# Patient Record
Sex: Male | Born: 1970 | Race: White | Hispanic: No | Smoking: Current every day smoker
Health system: Southern US, Community
[De-identification: ages and names within clinical notes are randomized; demographics above are authoritative.]

## PROBLEM LIST (undated history)

## (undated) DIAGNOSIS — I509 Heart failure, unspecified: Secondary | ICD-10-CM

## (undated) DIAGNOSIS — F319 Bipolar disorder, unspecified: Secondary | ICD-10-CM

## (undated) DIAGNOSIS — I2699 Other pulmonary embolism without acute cor pulmonale: Secondary | ICD-10-CM

## (undated) DIAGNOSIS — I429 Cardiomyopathy, unspecified: Secondary | ICD-10-CM

## (undated) DIAGNOSIS — F32A Depression, unspecified: Secondary | ICD-10-CM

## (undated) DIAGNOSIS — I214 Non-ST elevation (NSTEMI) myocardial infarction: Secondary | ICD-10-CM

## (undated) DIAGNOSIS — J439 Emphysema, unspecified: Secondary | ICD-10-CM

## (undated) DIAGNOSIS — F1990 Other psychoactive substance use, unspecified, uncomplicated: Secondary | ICD-10-CM

## (undated) DIAGNOSIS — M6282 Rhabdomyolysis: Secondary | ICD-10-CM

## (undated) DIAGNOSIS — F329 Major depressive disorder, single episode, unspecified: Secondary | ICD-10-CM

## (undated) HISTORY — DX: Rhabdomyolysis: M62.82

## (undated) HISTORY — DX: Cardiomyopathy, unspecified: I42.9

## (undated) HISTORY — PX: HAND SURGERY: SHX662

## (undated) HISTORY — DX: Non-ST elevation (NSTEMI) myocardial infarction: I21.4

---

## 1998-09-12 ENCOUNTER — Emergency Department (HOSPITAL_COMMUNITY): Admission: EM | Admit: 1998-09-12 | Discharge: 1998-09-12 | Payer: Self-pay | Admitting: Emergency Medicine

## 2005-09-27 ENCOUNTER — Emergency Department (HOSPITAL_COMMUNITY): Admission: EM | Admit: 2005-09-27 | Discharge: 2005-09-27 | Payer: Self-pay | Admitting: Emergency Medicine

## 2006-05-14 ENCOUNTER — Emergency Department (HOSPITAL_COMMUNITY): Admission: EM | Admit: 2006-05-14 | Discharge: 2006-05-14 | Payer: Self-pay | Admitting: Emergency Medicine

## 2011-09-20 DIAGNOSIS — I2699 Other pulmonary embolism without acute cor pulmonale: Secondary | ICD-10-CM

## 2011-09-20 HISTORY — DX: Other pulmonary embolism without acute cor pulmonale: I26.99

## 2012-02-12 ENCOUNTER — Emergency Department (HOSPITAL_COMMUNITY): Payer: Self-pay

## 2012-02-12 ENCOUNTER — Emergency Department (HOSPITAL_COMMUNITY)
Admission: EM | Admit: 2012-02-12 | Discharge: 2012-02-12 | Disposition: A | Discharge status: Home / Self Care | Payer: Self-pay | Attending: Emergency Medicine | Admitting: Emergency Medicine

## 2012-02-12 ENCOUNTER — Encounter (HOSPITAL_COMMUNITY): Payer: Self-pay | Admitting: Emergency Medicine

## 2012-02-12 DIAGNOSIS — Z86711 Personal history of pulmonary embolism: Secondary | ICD-10-CM | POA: Insufficient documentation

## 2012-02-12 DIAGNOSIS — F319 Bipolar disorder, unspecified: Secondary | ICD-10-CM | POA: Insufficient documentation

## 2012-02-12 DIAGNOSIS — F3289 Other specified depressive episodes: Secondary | ICD-10-CM | POA: Insufficient documentation

## 2012-02-12 DIAGNOSIS — F17200 Nicotine dependence, unspecified, uncomplicated: Secondary | ICD-10-CM | POA: Insufficient documentation

## 2012-02-12 DIAGNOSIS — R197 Diarrhea, unspecified: Secondary | ICD-10-CM | POA: Insufficient documentation

## 2012-02-12 DIAGNOSIS — Z79899 Other long term (current) drug therapy: Secondary | ICD-10-CM | POA: Insufficient documentation

## 2012-02-12 DIAGNOSIS — F1210 Cannabis abuse, uncomplicated: Secondary | ICD-10-CM | POA: Insufficient documentation

## 2012-02-12 DIAGNOSIS — F329 Major depressive disorder, single episode, unspecified: Secondary | ICD-10-CM | POA: Insufficient documentation

## 2012-02-12 DIAGNOSIS — R112 Nausea with vomiting, unspecified: Secondary | ICD-10-CM | POA: Insufficient documentation

## 2012-02-12 HISTORY — DX: Other psychoactive substance use, unspecified, uncomplicated: F19.90

## 2012-02-12 HISTORY — DX: Emphysema, unspecified: J43.9

## 2012-02-12 HISTORY — DX: Major depressive disorder, single episode, unspecified: F32.9

## 2012-02-12 HISTORY — DX: Other pulmonary embolism without acute cor pulmonale: I26.99

## 2012-02-12 HISTORY — DX: Bipolar disorder, unspecified: F31.9

## 2012-02-12 HISTORY — DX: Depression, unspecified: F32.A

## 2012-02-12 LAB — CBC WITH DIFFERENTIAL/PLATELET
Eosinophils Absolute: 0.1 10*3/uL (ref 0.0–0.7)
Eosinophils Relative: 1 % (ref 0–5)
HCT: 42.4 % (ref 39.0–52.0)
Hemoglobin: 14.7 g/dL (ref 13.0–17.0)
Lymphs Abs: 1 10*3/uL (ref 0.7–4.0)
MCH: 30.5 pg (ref 26.0–34.0)
MCV: 88 fL (ref 78.0–100.0)
Monocytes Relative: 10 % (ref 3–12)
RBC: 4.82 MIL/uL (ref 4.22–5.81)

## 2012-02-12 LAB — COMPREHENSIVE METABOLIC PANEL
ALT: 11 U/L (ref 0–53)
AST: 11 U/L (ref 0–37)
Calcium: 9.2 mg/dL (ref 8.4–10.5)
Sodium: 135 mEq/L (ref 135–145)
Total Protein: 8.1 g/dL (ref 6.0–8.3)

## 2012-02-12 MED ORDER — ONDANSETRON HCL 4 MG PO TABS: 4.0000 mg | ORAL_TABLET | Freq: Three times a day (TID) | ORAL | Status: DC | PRN

## 2012-02-12 MED ORDER — SODIUM CHLORIDE 0.9 % IV SOLN
Freq: Once | INTRAVENOUS | Status: AC
  Administered 2012-02-12: 1000 mL via INTRAVENOUS

## 2012-02-12 MED ORDER — SODIUM CHLORIDE 0.9 % IV BOLUS (SEPSIS): 1000.0000 mL | Freq: Once | INTRAVENOUS | Status: DC

## 2012-02-12 MED ORDER — ONDANSETRON HCL 4 MG/2ML IJ SOLN
4.0000 mg | INTRAMUSCULAR | Status: DC | PRN
  Administered 2012-02-12: 4 mg via INTRAVENOUS
  Filled 2012-02-12: qty 2

## 2012-02-12 MED ORDER — SODIUM CHLORIDE 0.9 % IV SOLN
INTRAVENOUS | Status: DC
  Administered 2012-02-12: 14:00:00 via INTRAVENOUS

## 2012-02-12 NOTE — ED Notes (Signed)
Fluid challenge of ginger ale given and tolerated so far

## 2012-02-12 NOTE — ED Provider Notes (Signed)
History     CSN: [id number]  Arrival date & time 02/12/12  1230   First MD Initiated Contact with Patient 02/12/12 1306      Chief Complaint  Patient presents with  . Emesis  . Diarrhea     HPI Pt was seen at 1335.  Per pt, c/o gradual onset and persistence of multiple intermittent episodes of N/V/D that began last night.   Describes the stools as "watery."  States he had another family member with similar illness several days ago. Denies abd pain, no CP/SOB, no back pain, no fevers, no black or blood in stools or emesis.     Past Medical History  Diagnosis Date  . Emphysema   . Depression   . Bipolar 1 disorder   . Pulmonary embolism october 2013  . IV drug user     last IV drug use in 2011    Past Surgical History  Procedure Date  . Hand surgery     History  Substance Use Topics  . Smoking status: Current Every Day Smoker -- 1.0 packs/day    Types: Cigarettes  . Smokeless tobacco: Not on file  . Alcohol Use:      Comment: occasional      Review of Systems ROS: Statement: All systems negative except as marked or noted in the HPI; Constitutional: Negative for fever and chills. ; ; Eyes: Negative for eye pain, redness and discharge. ; ; ENMT: Negative for ear pain, hoarseness, nasal congestion, sinus pressure and sore throat. ; ; Cardiovascular: Negative for chest pain, palpitations, diaphoresis, dyspnea and peripheral edema. ; ; Respiratory: Negative for cough, wheezing and stridor. ; ; Gastrointestinal: +N/V/D. Negative for abdominal pain, blood in stool, hematemesis, jaundice and rectal bleeding. . ; ; Genitourinary: Negative for dysuria, flank pain and hematuria. ; ; Musculoskeletal: Negative for back pain and neck pain. Negative for swelling and trauma.; ; Skin: Negative for pruritus, rash, abrasions, blisters, bruising and skin lesion.; ; Neuro: Negative for headache, lightheadedness and neck stiffness. Negative for weakness, altered level of consciousness ,  altered mental status, extremity weakness, paresthesias, involuntary movement, seizure and syncope.       Allergies  Augmentin and Toradol  Home Medications   Current Outpatient Rx  Name  Route  Sig  Dispense  Refill  . BUDESONIDE-FORMOTEROL FUMARATE 160-4.5 MCG/ACT IN AERO   Inhalation   Inhale 2 puffs into the lungs 2 (two) times daily.         Marland Kitchen DIVALPROEX SODIUM 500 MG PO TBEC   Oral   Take 500-1,000 mg by mouth 2 (two) times daily. 500 mg in the morning and 1000 mg at bedtime         . GABAPENTIN 400 MG PO CAPS   Oral   Take 400 mg by mouth 4 (four) times daily.         Marland Kitchen MIRTAZAPINE 15 MG PO TABS   Oral   Take 30 mg by mouth at bedtime.         Marland Kitchen OLANZAPINE 10 MG PO TABS   Oral   Take 10 mg by mouth at bedtime.         . OMEPRAZOLE 20 MG PO CPDR   Oral   Take 20 mg by mouth daily.         . OXYCODONE HCL ER 20 MG PO T12A   Oral   Take 20 mg by mouth 4 (four) times daily.         Marland Kitchen  PROPRANOLOL HCL 20 MG PO TABS   Oral   Take 20 mg by mouth 2 (two) times daily.            BP 96/51  Pulse 134  Temp 97.6 F (36.4 C) (Oral)  Resp 18  Ht 6' (1.829 m)  Wt 170 lb (77.111 kg)  BMI 23.06 kg/m2  SpO2 98%  Physical Exam 1340: Physical examination:  Nursing notes reviewed; Vital signs and O2 SAT reviewed;  Constitutional: Thin, Well hydrated, In no acute distress; Head:  Normocephalic, atraumatic; Eyes: EOMI, PERRL, No scleral icterus; ENMT: Mouth and pharynx normal, Mucous membranes moist; Neck: Supple, Full range of motion, No lymphadenopathy; Cardiovascular: Regular rate and rhythm, No murmur, rub, or gallop; Respiratory: Breath sounds clear & equal bilaterally, No rales, rhonchi, wheezes.  Speaking full sentences with ease, Normal respiratory effort/excursion; Chest: Nontender, Movement normal; Abdomen: Soft, Nontender, Nondistended, Normal bowel sounds; Genitourinary: No CVA tenderness; Extremities: Pulses normal, No tenderness, No edema, No  calf edema or asymmetry.; Neuro: AA&Ox3, Major CN grossly intact.  Speech clear. No gross focal motor or sensory deficits in extremities.; Skin: Color normal, Warm, Dry.   ED Course  Procedures    MDM  MDM Reviewed: nursing note, vitals and previous chart Interpretation: labs and x-ray   Results for orders placed during the hospital encounter of 02/12/12  COMPREHENSIVE METABOLIC PANEL      Component Value Range   Sodium 135  135 - 145 mEq/L   Potassium 3.6  3.5 - 5.1 mEq/L   Chloride 100  96 - 112 mEq/L   CO2 21  19 - 32 mEq/L   Glucose, Bld 124 (*) 70 - 99 mg/dL   BUN 17  6 - 23 mg/dL   Creatinine, Ser 9.62  0.50 - 1.35 mg/dL   Calcium 9.2  8.4 - 95.2 mg/dL   Total Protein 8.1  6.0 - 8.3 g/dL   Albumin 3.7  3.5 - 5.2 g/dL   AST 11  0 - 37 U/L   ALT 11  0 - 53 U/L   Alkaline Phosphatase 66  39 - 117 U/L   Total Bilirubin 0.3  0.3 - 1.2 mg/dL   GFR calc non Af Amer 88 (*) >90 mL/min   GFR calc Af Amer >90  >90 mL/min  LIPASE, BLOOD      Component Value Range   Lipase 12  11 - 59 U/L  CBC WITH DIFFERENTIAL      Component Value Range   WBC 6.2  4.0 - 10.5 K/uL   RBC 4.82  4.22 - 5.81 MIL/uL   Hemoglobin 14.7  13.0 - 17.0 g/dL   HCT 84.1  32.4 - 40.1 %   MCV 88.0  78.0 - 100.0 fL   MCH 30.5  26.0 - 34.0 pg   MCHC 34.7  30.0 - 36.0 g/dL   RDW 02.7  25.3 - 66.4 %   Platelets 341  150 - 400 K/uL   Neutrophils Relative 72  43 - 77 %   Neutro Abs 4.5  1.7 - 7.7 K/uL   Lymphocytes Relative 17  12 - 46 %   Lymphs Abs 1.0  0.7 - 4.0 K/uL   Monocytes Relative 10  3 - 12 %   Monocytes Absolute 0.6  0.1 - 1.0 K/uL   Eosinophils Relative 1  0 - 5 %   Eosinophils Absolute 0.1  0.0 - 0.7 K/uL   Basophils Relative 0  0 - 1 %  Basophils Absolute 0.0  0.0 - 0.1 K/uL   Dg Abd Acute W/chest 02/12/2012  *RADIOLOGY REPORT*  Clinical Data: 41 year old male with nausea vomiting diarrhea.  ACUTE ABDOMEN SERIES (ABDOMEN 2 VIEW & CHEST 1 VIEW)  Comparison: None.  Findings: Lung volumes  appear large and may be related to body habitus.  Cardiac size and mediastinal contours are within normal limits.  Visualized tracheal air column is within normal limits. The lungs are clear.  Mild right apical lung scarring.  Otherwise the lungs are clear.  No pneumothorax or pneumoperitoneum.  Nonobstructed bowel gas pattern.  Abdominal and pelvic visceral contours are within normal limits. No acute osseous abnormality identified.  Incidental S1 spina bifida occulta.  IMPRESSION:  1. Nonobstructed bowel gas pattern, no free air. 2. No acute cardiopulmonary abnormality.   Original Report Authenticated By: Erskine Speed, M.D.       1605:  Pt did not c/o lightheadedness during orthostatic VS, but HR did increase/BP decrease.  IVF x2L NS given.  Pt states he feels "much better now" and "is hungry" and wants to go home. Pt has tol PO well while in the ED without N/V.  No stooling while in the ED.  Abd benign, VSS.  Dx and testing d/w pt and family.  Questions answered.  Verb understanding, agreeable to d/c home with outpt f/u.         Laray Anger, DO 02/15/12 [phone]

## 2012-02-12 NOTE — ED Notes (Signed)
Pt c/o n/v/d since last night

## 2012-09-21 ENCOUNTER — Emergency Department (HOSPITAL_COMMUNITY): Payer: Self-pay

## 2012-09-21 ENCOUNTER — Inpatient Hospital Stay (HOSPITAL_COMMUNITY)
Admission: EM | Admit: 2012-09-21 | Discharge: 2012-09-26 | DRG: 557 | Disposition: A | Discharge status: Home / Self Care | Payer: Self-pay | Attending: Internal Medicine | Admitting: Internal Medicine

## 2012-09-21 ENCOUNTER — Encounter (HOSPITAL_COMMUNITY): Payer: Self-pay | Admitting: *Deleted

## 2012-09-21 DIAGNOSIS — R7402 Elevation of levels of lactic acid dehydrogenase (LDH): Secondary | ICD-10-CM | POA: Diagnosis present

## 2012-09-21 DIAGNOSIS — R339 Retention of urine, unspecified: Secondary | ICD-10-CM | POA: Diagnosis present

## 2012-09-21 DIAGNOSIS — G8929 Other chronic pain: Secondary | ICD-10-CM | POA: Diagnosis present

## 2012-09-21 DIAGNOSIS — J9600 Acute respiratory failure, unspecified whether with hypoxia or hypercapnia: Secondary | ICD-10-CM | POA: Diagnosis present

## 2012-09-21 DIAGNOSIS — T40605A Adverse effect of unspecified narcotics, initial encounter: Secondary | ICD-10-CM | POA: Diagnosis present

## 2012-09-21 DIAGNOSIS — F17200 Nicotine dependence, unspecified, uncomplicated: Secondary | ICD-10-CM | POA: Diagnosis present

## 2012-09-21 DIAGNOSIS — N179 Acute kidney failure, unspecified: Secondary | ICD-10-CM | POA: Diagnosis present

## 2012-09-21 DIAGNOSIS — G4700 Insomnia, unspecified: Secondary | ICD-10-CM | POA: Diagnosis present

## 2012-09-21 DIAGNOSIS — F319 Bipolar disorder, unspecified: Secondary | ICD-10-CM | POA: Diagnosis present

## 2012-09-21 DIAGNOSIS — I428 Other cardiomyopathies: Secondary | ICD-10-CM | POA: Diagnosis present

## 2012-09-21 DIAGNOSIS — Z86711 Personal history of pulmonary embolism: Secondary | ICD-10-CM

## 2012-09-21 DIAGNOSIS — E119 Type 2 diabetes mellitus without complications: Secondary | ICD-10-CM | POA: Diagnosis present

## 2012-09-21 DIAGNOSIS — R7989 Other specified abnormal findings of blood chemistry: Secondary | ICD-10-CM

## 2012-09-21 DIAGNOSIS — N19 Unspecified kidney failure: Secondary | ICD-10-CM

## 2012-09-21 DIAGNOSIS — M549 Dorsalgia, unspecified: Secondary | ICD-10-CM | POA: Diagnosis present

## 2012-09-21 DIAGNOSIS — I214 Non-ST elevation (NSTEMI) myocardial infarction: Secondary | ICD-10-CM | POA: Diagnosis present

## 2012-09-21 DIAGNOSIS — J040 Acute laryngitis: Secondary | ICD-10-CM | POA: Diagnosis present

## 2012-09-21 DIAGNOSIS — R7401 Elevation of levels of liver transaminase levels: Secondary | ICD-10-CM | POA: Diagnosis present

## 2012-09-21 DIAGNOSIS — N139 Obstructive and reflux uropathy, unspecified: Secondary | ICD-10-CM | POA: Diagnosis present

## 2012-09-21 DIAGNOSIS — T50911A Poisoning by multiple unspecified drugs, medicaments and biological substances, accidental (unintentional), initial encounter: Secondary | ICD-10-CM | POA: Insufficient documentation

## 2012-09-21 DIAGNOSIS — F411 Generalized anxiety disorder: Secondary | ICD-10-CM | POA: Diagnosis present

## 2012-09-21 DIAGNOSIS — J811 Chronic pulmonary edema: Secondary | ICD-10-CM | POA: Diagnosis present

## 2012-09-21 DIAGNOSIS — R748 Abnormal levels of other serum enzymes: Secondary | ICD-10-CM | POA: Diagnosis present

## 2012-09-21 DIAGNOSIS — M6282 Rhabdomyolysis: Principal | ICD-10-CM | POA: Diagnosis present

## 2012-09-21 DIAGNOSIS — Z8249 Family history of ischemic heart disease and other diseases of the circulatory system: Secondary | ICD-10-CM

## 2012-09-21 DIAGNOSIS — E876 Hypokalemia: Secondary | ICD-10-CM | POA: Diagnosis present

## 2012-09-21 DIAGNOSIS — J438 Other emphysema: Secondary | ICD-10-CM | POA: Diagnosis present

## 2012-09-21 DIAGNOSIS — R1310 Dysphagia, unspecified: Secondary | ICD-10-CM | POA: Diagnosis present

## 2012-09-21 LAB — URINALYSIS, ROUTINE W REFLEX MICROSCOPIC
Leukocytes, UA: NEGATIVE
Nitrite: NEGATIVE
Specific Gravity, Urine: 1.015 (ref 1.005–1.030)
Urobilinogen, UA: 0.2 mg/dL (ref 0.0–1.0)

## 2012-09-21 LAB — COMPREHENSIVE METABOLIC PANEL
ALT: 213 U/L — ABNORMAL HIGH (ref 0–53)
Calcium: 8.1 mg/dL — ABNORMAL LOW (ref 8.4–10.5)
GFR calc Af Amer: 52 mL/min — ABNORMAL LOW (ref >90)
Glucose, Bld: 117 mg/dL — ABNORMAL HIGH (ref 70–99)
Sodium: 134 mEq/L — ABNORMAL LOW (ref 135–145)
Total Protein: 6.3 g/dL (ref 6.0–8.3)

## 2012-09-21 LAB — CBC WITH DIFFERENTIAL/PLATELET
Eosinophils Absolute: 0.1 10*3/uL (ref 0.0–0.7)
Eosinophils Relative: 1 % (ref 0–5)
Lymphs Abs: 1.5 10*3/uL (ref 0.7–4.0)
MCH: 30.4 pg (ref 26.0–34.0)
MCV: 89.2 fL (ref 78.0–100.0)
Platelets: 235 10*3/uL (ref 150–400)
RBC: 3.52 MIL/uL — ABNORMAL LOW (ref 4.22–5.81)
RDW: 13 % (ref 11.5–15.5)

## 2012-09-21 LAB — BLOOD GAS, ARTERIAL
Acid-Base Excess: 5.7 mmol/L — ABNORMAL HIGH (ref 0.0–2.0)
Drawn by: 317771
O2 Saturation: 89 %
pH, Arterial: 7.428 (ref 7.350–7.450)

## 2012-09-21 LAB — CK: Total CK: >20000 U/L — ABNORMAL HIGH (ref 7–232)

## 2012-09-21 LAB — RAPID URINE DRUG SCREEN, HOSP PERFORMED
Cocaine: NOT DETECTED
Opiates: NOT DETECTED

## 2012-09-21 LAB — URINE MICROSCOPIC-ADD ON

## 2012-09-21 LAB — LACTIC ACID, PLASMA: Lactic Acid, Venous: 1.1 mmol/L (ref 0.5–2.2)

## 2012-09-21 LAB — TROPONIN I: Troponin I: 2.57 ng/mL (ref <0.30)

## 2012-09-21 MED ORDER — OXYCODONE HCL 5 MG PO TABS
5.0000 mg | ORAL_TABLET | ORAL | Status: DC | PRN
  Administered 2012-09-21 – 2012-09-22 (×4): 5 mg via ORAL
  Filled 2012-09-21 (×4): qty 1

## 2012-09-21 MED ORDER — FUROSEMIDE 10 MG/ML IJ SOLN
40.0000 mg | Freq: Once | INTRAMUSCULAR | Status: AC
  Administered 2012-09-21: 40 mg via INTRAVENOUS
  Filled 2012-09-21: qty 4

## 2012-09-21 MED ORDER — LORAZEPAM 2 MG/ML IJ SOLN
1.0000 mg | INTRAMUSCULAR | Status: DC | PRN
  Administered 2012-09-21: 1 mg via INTRAVENOUS
  Filled 2012-09-21: qty 1

## 2012-09-21 MED ORDER — SODIUM CHLORIDE 0.9 % IV BOLUS (SEPSIS): 1000.0000 mL | Freq: Once | INTRAVENOUS | Status: DC

## 2012-09-21 MED ORDER — POTASSIUM CHLORIDE CRYS ER 20 MEQ PO TBCR
40.0000 meq | EXTENDED_RELEASE_TABLET | Freq: Once | ORAL | Status: AC
  Administered 2012-09-21: 40 meq via ORAL
  Filled 2012-09-21: qty 2

## 2012-09-21 NOTE — ED Notes (Signed)
Pt was admitted to ICU at [facility] last night and pt states that he left AMA this morning, suicide attempt vs kidney failure, mother states pt was unresponsive, sats in 80's in triage

## 2012-09-21 NOTE — Consult Note (Addendum)
Reason for Consult: Elevated Troponin Primary Cardiologist: none Referring Physician: Dr. Ranae Day and [facility]  Bradley Day is an 42 y.o. male.  HPI: Bradley Day is a 42 yo man with PMH of bipolar disorder, prior IV drug use, depression and prior pulmonary embolism currently off anticoagulation who was recently admitted at [facility] in the ER after patient was found down and apneic by his mother. He was subsequently intubated in the ER in [place] and transferred to the ICU. He recently extubated after waking up and expressing his wishes to have the ETT removed, removed a central line, signed out AMA and drove to [facility] with his mother. Dr. Ranae Day from  [facility] called Cardiology about an elevated troponin in the setting of Rhabdomyolysis and their plan to transfer to [facility] on the Hospitalist service and for Cardiology to officially consult on arrival. On my evaluation of patient with his mother at bedside, he was mildly somnolent but easily arousable. Mother told me how she found him and corroborated above story of apnea with worry he was stopping breathing. She's also worried he needs a sleep study for sleep apnea. He takes 6 percocet a day (with 30 mg he states regarding oxycodone dose, not 5 or 10 mg), anti-depressants, among other medications for chronic pain. He has no chest pain, no syncope, no shortness of breath. He was doing upwards of 400 pushups a day until June 4th but has done none since. He also endorses urinary retention with difficulty urinating. Per nursing staff he's urinated 2900 ml from [facility] to [facility] recorded.     Past Medical History  Diagnosis Date  . Emphysema   . Depression   . Bipolar 1 disorder   . Pulmonary embolism August 2013    off anticoagulation 12/2011 per pt  . IV drug user     last IV drug use in 2011    Past Surgical History  Procedure Laterality Date  . Hand surgery      History reviewed. No pertinent  family history. No known family history of CAD, reviewed with mother as well  Social History:  reports that he has been smoking Cigarettes.  He has been smoking about 1.00 pack per day. He does not have any smokeless tobacco history on file. He reports that he uses illicit drugs (Cocaine, Marijuana, and IV). His alcohol history is not on file.  Allergies:  Allergies  Allergen Reactions  . Augmentin (Amoxicillin-Pot Clavulanate)     unknown  . Toradol (Ketorolac Tromethamine)     Severe headaches     Medications:  I have reviewed the patient's current medications. Prior to Admission:  (Not in a hospital admission) Scheduled: . furosemide  40 mg Intravenous Once  . potassium chloride  40 mEq Oral Once    Results for orders placed during the hospital encounter of 09/21/12 (from the past 48 hour(s))  CBC WITH DIFFERENTIAL     Status: Abnormal   Collection Time    09/21/12  9:24 PM      Result Value Range   WBC 12.5 (*) 4.0 - 10.5 K/uL   RBC 3.52 (*) 4.22 - 5.81 MIL/uL   Hemoglobin 10.7 (*) 13.0 - 17.0 g/dL   HCT 16.1 (*) 09.6 - 04.5 %   MCV 89.2  78.0 - 100.0 fL   MCH 30.4  26.0 - 34.0 pg   MCHC 34.1  30.0 - 36.0 g/dL   RDW 40.9  81.1 - 91.4 %  Platelets 235  150 - 400 K/uL   Neutrophils Relative % 77  43 - 77 %   Neutro Abs 9.7 (*) 1.7 - 7.7 K/uL   Lymphocytes Relative 12  12 - 46 %   Lymphs Abs 1.5  0.7 - 4.0 K/uL   Monocytes Relative 10  3 - 12 %   Monocytes Absolute 1.2 (*) 0.1 - 1.0 K/uL   Eosinophils Relative 1  0 - 5 %   Eosinophils Absolute 0.1  0.0 - 0.7 K/uL   Basophils Relative 0  0 - 1 %   Basophils Absolute 0.0  0.0 - 0.1 K/uL  COMPREHENSIVE METABOLIC PANEL     Status: Abnormal   Collection Time    09/21/12  9:24 PM      Result Value Range   Sodium 134 (*) 135 - 145 mEq/L   Potassium 3.2 (*) 3.5 - 5.1 mEq/L   Chloride 94 (*) 96 - 112 mEq/L   CO2 32  19 - 32 mEq/L   Glucose, Bld 117 (*) 70 - 99 mg/dL   BUN 12  6 - 23 mg/dL   Creatinine, Ser 4.09 (*)  0.50 - 1.35 mg/dL   Calcium 8.1 (*) 8.4 - 10.5 mg/dL   Total Protein 6.3  6.0 - 8.3 g/dL   Albumin 2.9 (*) 3.5 - 5.2 g/dL   AST 811 (*) 0 - 37 U/L   ALT 213 (*) 0 - 53 U/L   Alkaline Phosphatase 82  39 - 117 U/L   Total Bilirubin 0.3  0.3 - 1.2 mg/dL   GFR calc non Af Amer 44 (*) >90 mL/min   GFR calc Af Amer 52 (*) >90 mL/min   Comment:            The eGFR has been calculated     using the CKD EPI equation.     This calculation has not been     validated in all clinical     situations.     eGFR's persistently     <90 mL/min signify     possible Chronic Kidney Disease.  TROPONIN I     Status: Abnormal   Collection Time    09/21/12  9:24 PM      Result Value Range   Troponin I 2.57 (*) <0.30 ng/mL   Comment:            Due to the release kinetics of cTnI,     a negative result within the first hours     of the onset of symptoms does not rule out     myocardial infarction with certainty.     If myocardial infarction is still suspected,     repeat the test at appropriate intervals.     RESULT REPEATED AND VERIFIED     CRITICAL RESULT CALLED TO, READ BACK BY AND VERIFIED WITH:     J. HEDLEY AT 2224 ON 09/21/12 BY S. VANHOORNE  ETHANOL     Status: None   Collection Time    09/21/12  9:24 PM      Result Value Range   Alcohol, Ethyl (B) <11  0 - 11 mg/dL   Comment:            LOWEST DETECTABLE LIMIT FOR     SERUM ALCOHOL IS 11 mg/dL     FOR MEDICAL PURPOSES ONLY  PRO B NATRIURETIC PEPTIDE     Status: Abnormal   Collection Time    09/21/12  9:24 PM  Result Value Range   Pro B Natriuretic peptide (BNP) 3389.0 (*) 0 - 125 pg/mL  LACTIC ACID, PLASMA     Status: None   Collection Time    09/21/12  9:24 PM      Result Value Range   Lactic Acid, Venous 1.1  0.5 - 2.2 mmol/L  BLOOD GAS, ARTERIAL     Status: Abnormal   Collection Time    09/21/12  9:54 PM      Result Value Range   O2 Content 3.0     Delivery systems NASAL CANNULA     pH, Arterial 7.428  7.350 - 7.450    pCO2 arterial 46.1 (*) 35.0 - 45.0 mmHg   pO2, Arterial 55.6 (*) 80.0 - 100.0 mmHg   Bicarbonate 29.9 (*) 20.0 - 24.0 mEq/L   Acid-Base Excess 5.7 (*) 0.0 - 2.0 mmol/L   O2 Saturation 89.0     Patient temperature 37.8     Collection site RIGHT RADIAL     Drawn by [id number]     Sample type ARTERIAL DRAW     Allens test (pass/fail) PASS  PASS    Dg Chest Port 1 View  09/21/2012   *RADIOLOGY REPORT*  Clinical Data: Hypoxia, COPD  PORTABLE CHEST - 1 VIEW  Comparison: 02/12/2012  Findings: Diffuse interstitial edema pattern throughout the lungs compatible with CHF.  Bibasilar atelectasis also evident.  No large effusion or pneumothorax.  Trachea midline.  IMPRESSION: Mild diffuse interstitial edema and basilar atelectasis.   Original Report Authenticated By: Judie Petit. Miles Costain, M.D.    Review of Systems  Constitutional: Positive for malaise/fatigue. Negative for fever and chills.  HENT: Negative for ear pain and ear discharge.   Eyes: Negative for double vision and photophobia.  Respiratory: Positive for cough, shortness of breath and wheezing.   Cardiovascular: Negative for chest pain, palpitations and orthopnea.  Gastrointestinal: Negative for nausea, vomiting, abdominal pain and diarrhea.  Genitourinary: Positive for flank pain.       Urinary retention  Skin: Positive for itching.  Neurological: Positive for speech change. Negative for dizziness and tingling.       Hoarseness from ETT  Endo/Heme/Allergies: Negative for environmental allergies. Does not bruise/bleed easily.  Psychiatric/Behavioral: Positive for depression and substance abuse. Negative for suicidal ideas.   Blood pressure 113/62, pulse 126, temperature 100 F (37.8 C), temperature source Oral, resp. rate 22, height 6\' 1"  (1.854 m), weight 81.647 kg (180 lb), SpO2 86.00%. Physical Exam  Nursing note and vitals reviewed. Constitutional: He appears well-nourished. He appears distressed.  Appears slightly older than stated age,  Bradley Day, interacts fairly appropriately with me  HENT:  Head: Normocephalic and atraumatic.  Nose: Nose normal.  Mouth/Throat: Oropharynx is clear and moist. No oropharyngeal exudate.  Eyes: Conjunctivae and EOM are normal. Pupils are equal, round, and reactive to light. No scleral icterus.  Neck: Normal range of motion. Neck supple. No JVD present. No tracheal deviation present. No thyromegaly present.  Cardiovascular: Regular rhythm, normal heart sounds and intact distal pulses.  Exam reveals no gallop.   No murmur heard. Mildly tachycardic 110s when I evaluated him  Respiratory: Effort normal. He has wheezes. He exhibits no tenderness.  Occasional wheezes  GI: Soft. Bowel sounds are normal. He exhibits no distension. There is no tenderness. There is no rebound.  Musculoskeletal: Normal range of motion. He exhibits no edema and no tenderness.  Neurological: He is alert.  Alert, arousable, oriented to person, hospital location  Skin: Skin is warm and  dry. No rash noted. He is not diaphoretic. No erythema.  Psychiatric:  Somnolent, hoarse    Labs reviewed; na 134, K 3.2, bun/cr 12/1.8, ast/alt 621/213 Troponin I 2.6 --> 1.6, BNP 3390, lactate 1.1, total protein 6.3 ECG reviewed; HR 130s, ? Inferior Qs, QTc 450 ABG 7.43/46/56/89% Chest x-ray: mild pulmonary edema Blood Culture x 2 CK > 20,000 Problem List Hypoxia Rhabdomyolysis Acute renal failure Elevated Troponin Urinary retention Transaminitis Tachycardia Chronic Back Pain  Assessment/Plan: 42 yo man with PMH of biopolar disorder, depression, prior IV drug abuse, prior PE admitted at [facility] after apnea and respiratory failure with subsequent self-extubation and choice to leave AMA before drivign to [facility] here with multiple medical problems. Cardiology asked to consult on elevated troponin in setting of respiratory failure/hypoxia, acute renal failure and rhabdomyolysis. Differential diagnosis for elevated  troponin in this setting is certainly demand ischemia, acute coronary syndrome and exacerbation from renal failure and rhabdomyolysis. I favor demand ischemia in setting of renal failure, rhabdomyolysis and respiratory failure. Otherwise, would continue supportive medical therapy and evaluating for other causes of hypoxia and rhabdomyolysis (toxins, methanol, uremia, diabetes, paraldehyde, isopropyl alcohol, lactic acidosis, ethylene glycol, salicylates). He has urinary retention as well so the clinical picture looks consistent with chronic narcotic use for back pain leading to somnolence and urinary retention and rhabdomyolysis. He may have underlying CAD. Primary team initiated heparin, which is not unreasonable. Will obtain echocardiogram and if no WMA, could consider d/c heparin. Otherwise, if any abnormalities may need to pursue LHC when renal function more stable.  - trend troponins, continue on telemetry - will reassess and consider further ischemic workup based on echocardiogram (already ordered) - reasonable to continue heparin until echo - would treat elevated CK/rhabdo with gentle IV fluid - aspirin 81 mg once daily  Bradley Day 09/21/2012, 10:59 PM

## 2012-09-21 NOTE — ED Provider Notes (Signed)
[facility] Dr. Sharl Ma to transfer pt to [facility] Stepdown unit, Team 10, [facility] Dr. Betti Cruz. Bed request placed. Dr Sharl Ma requests to start IV heparin per pharmacy consult due to elevated troponin.   Laray Anger, DO 09/21/12 2300

## 2012-09-21 NOTE — H&P (Addendum)
PCP:   No primary provider on file.   Chief Complaint:  Shortness of breath  HPI: 42 year old male with a history of bipolar disorder, pulmonary embolism off anticoagulation after completion of 6 months on the Xarelto  as per patient, chronic back pain, chronic opiate use who was initially admitted to the [facility] hospital on 09/20/2012 after patient was found to be unresponsive by his mother. Patient has been taking oxycodone, Neurontin and was recently started on amitriptyline 150 mg by mouth daily. Patient was intubated and sedated to protect the airway, today patient left AMA from [facility] after they told patient that he tried to commit suicide, as per patient. Patient came to the [facility], and was found to be hypoxic with O2 sats in 80s, urinary retention With bladder scan showing 600 cc of urine. The lab work done in the ED showed significant elevation of troponin of 2.57, his previous troponin from [facility] was 2.210. Him patient's [place] CPK [place] 238[address], CK-MB 173.8. EKG done in the ED shows no acute changes. At this time patient denies chest pain, no shortness of breath his O2 sats have improved in the ED. Appears very restless, denies nausea vomiting or diarrhea. Admits to having some dysuria. No fever.    Alergies:   Allergies  Allergen Reactions  . Augmentin (Amoxicillin-Pot Clavulanate)     unknown  . Toradol (Ketorolac Tromethamine)     Severe headaches       Past Medical History  Diagnosis Date  . Emphysema   . Depression   . Bipolar 1 disorder   . Pulmonary embolism August 2013    off anticoagulation 12/2011 per pt  . IV drug user     last IV drug use in 2011    Past Surgical History  Procedure Laterality Date  . Hand surgery      Prior to Admission medications   Medication Sig Start Date End Date Taking? Authorizing Provider  albuterol (PROVENTIL HFA;VENTOLIN HFA) 108 (90 BASE) MCG/ACT inhaler Inhale 2 puffs  into the lungs every 6 (six) hours as needed for wheezing or shortness of breath.   Yes Historical Provider, MD  ALPRAZolam Prudy Feeler) 0.5 MG tablet Take 0.5 mg by mouth 3 (three) times daily.   Yes Historical Provider, MD  amitriptyline (ELAVIL) 150 MG tablet Take 150 mg by mouth at bedtime.   Yes Historical Provider, MD  gabapentin (NEURONTIN) 800 MG tablet Take 800 mg by mouth 3 (three) times daily.   Yes Historical Provider, MD  metFORMIN (GLUCOPHAGE) 500 MG tablet Take 500 mg by mouth 2 (two) times daily.   Yes Historical Provider, MD  omeprazole (PRILOSEC) 20 MG capsule Take 20 mg by mouth daily.   Yes Historical Provider, MD  oxycodone (ROXICODONE) 30 MG immediate release tablet Take 30 mg by mouth 6 (six) times daily.   Yes Historical Provider, MD  tiotropium (SPIRIVA) 18 MCG inhalation capsule Place 18 mcg into inhaler and inhale daily.   Yes Historical Provider, MD    Social History:  reports that he has been smoking Cigarettes.  He has been smoking about 1.00 pack per day. He does not have any smokeless tobacco history on file. He reports that he uses illicit drugs (Cocaine, Marijuana, and IV). His alcohol history is not on file.   family history: Patient's father died of massive MI at the age of 13   All the positives are listed in BOLD  Review of Systems:  HEENT: Headache, blurred  vision, runny nose, sore throat Neck: Hypothyroidism, hyperthyroidism,,lymphadenopathy Chest : Shortness of breath, history of COPD, Asthma Heart : Chest pain, history of coronary arterey disease GI:  Nausea, vomiting, diarrhea, constipation, GERD GU: Dysuria, urgency, frequency of urination, hematuria Neuro: Stroke, seizures, syncope Psych: Depression, anxiety, hallucinations   Physical Exam: Blood pressure 113/62, pulse 126, temperature 100 F (37.8 C), temperature source Oral, resp. rate 22, height 6\' 1"  (1.854 m), weight 81.647 kg (180 lb), SpO2 86.00%. Constitutional:   Patient is a  well-developed and well-nourished male  in no acute distress and cooperative with exam. Head: Normocephalic and atraumatic Mouth: Mucus membranes moist Eyes: PERRL, EOMI, conjunctivae normal Neck: Supple, No Thyromegaly Cardiovascular: RRR, S1 normal, S2 normal Pulmonary/Chest: bibasilar crackles  Abdominal: Soft. Non-tender, non-distended, bowel sounds are normal, no masses, organomegaly, or guarding present.  Neurological: A&O x3, Strenght is normal and symmetric bilaterally, cranial nerve II-XII are grossly intact, no focal motor deficit, sensory intact to light touch bilaterally.  Extremities : No Cyanosis, Clubbing or Edema   Labs on Admission:  Results for orders placed during the hospital encounter of 09/21/12 (from the past 48 hour(s))  CBC WITH DIFFERENTIAL     Status: Abnormal   Collection Time    09/21/12  9:24 PM      Result Value Range   WBC 12.5 (*) 4.0 - 10.5 K/uL   RBC 3.52 (*) 4.22 - 5.81 MIL/uL   Hemoglobin 10.7 (*) 13.0 - 17.0 g/dL   HCT 16.1 (*) 09.6 - 04.5 %   MCV 89.2  78.0 - 100.0 fL   MCH 30.4  26.0 - 34.0 pg   MCHC 34.1  30.0 - 36.0 g/dL   RDW 40.9  81.1 - 91.4 %   Platelets 235  150 - 400 K/uL   Neutrophils Relative % 77  43 - 77 %   Neutro Abs 9.7 (*) 1.7 - 7.7 K/uL   Lymphocytes Relative 12  12 - 46 %   Lymphs Abs 1.5  0.7 - 4.0 K/uL   Monocytes Relative 10  3 - 12 %   Monocytes Absolute 1.2 (*) 0.1 - 1.0 K/uL   Eosinophils Relative 1  0 - 5 %   Eosinophils Absolute 0.1  0.0 - 0.7 K/uL   Basophils Relative 0  0 - 1 %   Basophils Absolute 0.0  0.0 - 0.1 K/uL  COMPREHENSIVE METABOLIC PANEL     Status: Abnormal   Collection Time    09/21/12  9:24 PM      Result Value Range   Sodium 134 (*) 135 - 145 mEq/L   Potassium 3.2 (*) 3.5 - 5.1 mEq/L   Chloride 94 (*) 96 - 112 mEq/L   CO2 32  19 - 32 mEq/L   Glucose, Bld 117 (*) 70 - 99 mg/dL   BUN 12  6 - 23 mg/dL   Creatinine, Ser 7.82 (*) 0.50 - 1.35 mg/dL   Calcium 8.1 (*) 8.4 - 10.5 mg/dL   Total  Protein 6.3  6.0 - 8.3 g/dL   Albumin 2.9 (*) 3.5 - 5.2 g/dL   AST 956 (*) 0 - 37 U/L   ALT 213 (*) 0 - 53 U/L   Alkaline Phosphatase 82  39 - 117 U/L   Total Bilirubin 0.3  0.3 - 1.2 mg/dL   GFR calc non Af Amer 44 (*) >90 mL/min   GFR calc Af Amer 52 (*) >90 mL/min   Comment:  The eGFR has been calculated     using the CKD EPI equation.     This calculation has not been     validated in all clinical     situations.     eGFR's persistently     <90 mL/min signify     possible Chronic Kidney Disease.  TROPONIN I     Status: Abnormal   Collection Time    09/21/12  9:24 PM      Result Value Range   Troponin I 2.57 (*) <0.30 ng/mL   Comment:            Due to the release kinetics of cTnI,     a negative result within the first hours     of the onset of symptoms does not rule out     myocardial infarction with certainty.     If myocardial infarction is still suspected,     repeat the test at appropriate intervals.     RESULT REPEATED AND VERIFIED     CRITICAL RESULT CALLED TO, READ BACK BY AND VERIFIED WITH:     J. HEDLEY AT 2224 ON 09/21/12 BY Wynonia Lawman  ETHANOL     Status: None   Collection Time    09/21/12  9:24 PM      Result Value Range   Alcohol, Ethyl (B) <11  0 - 11 mg/dL   Comment:            LOWEST DETECTABLE LIMIT FOR     SERUM ALCOHOL IS 11 mg/dL     FOR MEDICAL PURPOSES ONLY  PRO B NATRIURETIC PEPTIDE     Status: Abnormal   Collection Time    09/21/12  9:24 PM      Result Value Range   Pro B Natriuretic peptide (BNP) 3389.0 (*) 0 - 125 pg/mL  LACTIC ACID, PLASMA     Status: None   Collection Time    09/21/12  9:24 PM      Result Value Range   Lactic Acid, Venous 1.1  0.5 - 2.2 mmol/L  BLOOD GAS, ARTERIAL     Status: Abnormal   Collection Time    09/21/12  9:54 PM      Result Value Range   O2 Content 3.0     Delivery systems NASAL CANNULA     pH, Arterial 7.428  7.350 - 7.450   pCO2 arterial 46.1 (*) 35.0 - 45.0 mmHg   pO2, Arterial 55.6  (*) 80.0 - 100.0 mmHg   Bicarbonate 29.9 (*) 20.0 - 24.0 mEq/L   Acid-Base Excess 5.7 (*) 0.0 - 2.0 mmol/L   O2 Saturation 89.0     Patient temperature 37.8     Collection site RIGHT RADIAL     Drawn by [id number]     Sample type ARTERIAL DRAW     Allens test (pass/fail) PASS  PASS    Radiological Exams on Admission: Dg Chest Port 1 View  09/21/2012   *RADIOLOGY REPORT*  Clinical Data: Hypoxia, COPD  PORTABLE CHEST - 1 VIEW  Comparison: 02/12/2012  Findings: Diffuse interstitial edema pattern throughout the lungs compatible with CHF.  Bibasilar atelectasis also evident.  No large effusion or pneumothorax.  Trachea midline.  IMPRESSION: Mild diffuse interstitial edema and basilar atelectasis.   Original Report Authenticated By: Judie Petit. Miles Costain, M.D.    Assessment/Plan Active Problems:   Rhabdomyolysis   AKI (acute kidney injury)   NSTEMI (non-ST elevated myocardial infarction)   Urinary retention   Drug  overdose, multiple drugs   Transaminitis   Hypokalemia   Diabetes mellitus   Chronic back pain  Non-STEMI Patient was started on heparin infusion protocol, aspirin 325 mg by mouth daily We'll transfer the patient to [facility] Have called and spoken to Dr. Tresa Endo cardiologist fellow on call at [facility]. He'll see the patient when he arrives at [place].   Rhabdomyolysis Patient says that he has been working out for past few months, not sure whether patient was laying in one position after he took his pain medications. He will require IV fluids, but at this time due to hypoxia and pulmonary edema, will not start IV fluids. Can be started fluids in the morning.  Pulmonary edema Chest x-ray shows pulmonary edema, BNP is markedly elevated to 33,000. We'll give 1 dose of Lasix in the ED And start Lasix 20 mg IV every 12 hours Will also order 2-D echocardiogram  Urinary retention Most due to the anticholinergic side effects of amitriptyline Also patient has been on high-dose  oxycodone, which could also be the culprit Foley catheter has been inserted. Can try voiding trial in one to 2 days  Diabetes mellitus Will hold the metformin at this time Start sliding scale insulin  Transaminitis Patient has elevation of liver enzymes ? Cause, will follow liver enzymes in a.m. As per mother patient was hypotensive at [facility] which could have contributed to shock liver.  Altered mental status Resolved, most likely due to polypharmacy Patient was on amitriptyline 150 mg, oxycodone 30 mg 6 times daily, Neurontin 800 3 times a day, Xanax 0.5 3 times a day.  Hypokalemia Will replace the potassium Follow BMP in morning  Chronic back pain Will start patient on oxycodone 5 mg Q4 hours when necessary  Anxiety/insomnia Patient will be started on Ativan 1 mg every 4 hours when necessary  AKI Will follow the BUN creatinine closely, since we have started him on Lasix.  Code status: full code   Family discussion: discussed with patient's mother at bedside   At this time patient will be transferred to [facility], have called and discussed with accepting physician Dr. Andreas Blower.  Time Spent on Admission: 95 min  Hennessy Bartel S [facility] Pager: [phone] 09/21/2012, 11:04 PM  If 7PM-7AM, please contact night-coverage  [email or web address]  Password [email or web address]

## 2012-09-21 NOTE — ED Notes (Signed)
Pt with hx of kidney failure, states last voided few minutes ago, gait unsteady is new and numbness on right hand but finger tips burn, states left hand jerking today

## 2012-09-21 NOTE — Progress Notes (Signed)
ABG obtained from patient, results were as followed PH-7.42  CO2-46.1 02-55.6  HC03-29.9  Patient was on [facility] when this was drawn, increased 02 to 4L. Patient ordered bipap, but will hold at this time. RT will continue to monitor

## 2012-09-21 NOTE — ED Provider Notes (Signed)
CSN: [id number]     Arrival date & time 09/21/12  2011 History     First MD Initiated Contact with Patient 09/21/12 2039     Chief Complaint  Patient presents with  . Back Pain  . Urinary Retention    hx of kidney failure   (Consider location/radiation/quality/duration/timing/severity/associated sxs/prior Treatment) HPI Pt found apneic and unresponsive by mother who called EMS. Pt intubated in ED in [place] and admitted to ICU. Pt recently extubated. He was unhappy with his care due to questions about whether pt attempted suicide. He signed himself out of the ICU AMA several hour before this presentation. Pt c/o difficulty urinating and left flank pain. Denies CP, SOB, cough. +hoarse voice after intubation.  Past Medical History  Diagnosis Date  . Emphysema   . Depression   . Bipolar 1 disorder   . Pulmonary embolism August 2013    off anticoagulation 12/2011 per pt  . IV drug user     last IV drug use in 2011   Past Surgical History  Procedure Laterality Date  . Hand surgery     History reviewed. No pertinent family history. History  Substance Use Topics  . Smoking status: Current Every Day Smoker -- 1.00 packs/day    Types: Cigarettes  . Smokeless tobacco: Not on file  . Alcohol Use:      Comment: occasional    Review of Systems  Constitutional: Positive for fever and fatigue. Negative for chills.  HENT: Negative for neck pain and neck stiffness.   Respiratory: Negative for cough and shortness of breath.   Cardiovascular: Negative for chest pain, palpitations and leg swelling.  Gastrointestinal: Negative for nausea, vomiting, abdominal pain, diarrhea and constipation.  Genitourinary: Positive for flank pain and difficulty urinating.  Musculoskeletal: Positive for back pain.  Skin: Negative for rash and wound.  Neurological: Positive for numbness. Negative for dizziness, syncope, weakness, light-headedness and headaches.  All other systems reviewed and are  negative.    Allergies  Augmentin and Toradol  Home Medications   Current Outpatient Rx  Name  Route  Sig  Dispense  Refill  . albuterol (PROVENTIL HFA;VENTOLIN HFA) 108 (90 BASE) MCG/ACT inhaler   Inhalation   Inhale 2 puffs into the lungs every 6 (six) hours as needed for wheezing or shortness of breath.         . ALPRAZolam (XANAX) 0.5 MG tablet   Oral   Take 0.5 mg by mouth 3 (three) times daily.         Marland Kitchen amitriptyline (ELAVIL) 150 MG tablet   Oral   Take 150 mg by mouth at bedtime.         . gabapentin (NEURONTIN) 800 MG tablet   Oral   Take 800 mg by mouth 3 (three) times daily.         . metFORMIN (GLUCOPHAGE) 500 MG tablet   Oral   Take 500 mg by mouth 2 (two) times daily.         Marland Kitchen omeprazole (PRILOSEC) 20 MG capsule   Oral   Take 20 mg by mouth daily.         Marland Kitchen oxycodone (ROXICODONE) 30 MG immediate release tablet   Oral   Take 30 mg by mouth 6 (six) times daily.         Marland Kitchen tiotropium (SPIRIVA) 18 MCG inhalation capsule   Inhalation   Place 18 mcg into inhaler and inhale daily.          BP  113/62  Pulse 126  Temp(Src) 100 F (37.8 C) (Oral)  Resp 22  Ht 6\' 1"  (1.854 m)  Wt 180 lb (81.647 kg)  BMI 23.75 kg/m2  SpO2 86% Physical Exam  Nursing note and vitals reviewed. Constitutional: He is oriented to person, place, and time. He appears well-developed and well-nourished. No distress.  HENT:  Head: Normocephalic and atraumatic.  Dry MM  Eyes: EOM are normal. Pupils are equal, round, and reactive to light.  Neck: Normal range of motion. Neck supple.  No meningismus   Cardiovascular: Regular rhythm.   tachycardia  Pulmonary/Chest: Effort normal and breath sounds normal. No respiratory distress. He has no wheezes. He has no rales. He exhibits no tenderness.  Abdominal: Soft. Bowel sounds are normal. He exhibits distension. He exhibits no mass. There is no tenderness. There is no rebound and no guarding.  Musculoskeletal: Normal  range of motion. He exhibits tenderness (Left CVAT, no midline T/L tenderness). He exhibits no edema.  Neurological: He is alert and oriented to person, place, and time.  5/5 motor in all ext, sensation intact  Skin: Skin is warm and dry. No rash noted. No erythema.    ED Course   Procedures (including critical care time)  Labs Reviewed  CBC WITH DIFFERENTIAL - Abnormal; Notable for the following:    WBC 12.5 (*)    RBC 3.52 (*)    Hemoglobin 10.7 (*)    HCT 31.4 (*)    Neutro Abs 9.7 (*)    Monocytes Absolute 1.2 (*)    All other components within normal limits  BLOOD GAS, ARTERIAL - Abnormal; Notable for the following:    pCO2 arterial 46.1 (*)    pO2, Arterial 55.6 (*)    Bicarbonate 29.9 (*)    Acid-Base Excess 5.7 (*)    All other components within normal limits  CULTURE, BLOOD (ROUTINE X 2)  CULTURE, BLOOD (ROUTINE X 2)  URINE CULTURE  LACTIC ACID, PLASMA  COMPREHENSIVE METABOLIC PANEL  TROPONIN I  URINALYSIS, ROUTINE W REFLEX MICROSCOPIC  URINE RAPID DRUG SCREEN (HOSP PERFORMED)  ETHANOL  PRO B NATRIURETIC PEPTIDE  CK   Dg Chest Port 1 View  09/21/2012   *RADIOLOGY REPORT*  Clinical Data: Hypoxia, COPD  PORTABLE CHEST - 1 VIEW  Comparison: 02/12/2012  Findings: Diffuse interstitial edema pattern throughout the lungs compatible with CHF.  Bibasilar atelectasis also evident.  No large effusion or pneumothorax.  Trachea midline.  IMPRESSION: Mild diffuse interstitial edema and basilar atelectasis.   Original Report Authenticated By: Judie Petit. Shick, M.D.   1. Pulmonary edema   2. Rhabdomyolysis   3. Renal failure   4. Urinary retention     Date: 09/21/2012  Rate: 130  Rhythm: sinus tachycardia  QRS Axis: normal  Intervals: normal  ST/T Wave abnormalities: normal  Conduction Disutrbances:none  Narrative Interpretation:   Old EKG Reviewed: none available  CRITICAL CARE Performed by: Ranae Palms, Ameera Tigue Total critical care time: 30 min Critical care time was exclusive  of separately billable procedures and treating other patients. Critical care was necessary to treat or prevent imminent or life-threatening deterioration. Critical care was time spent personally by me on the following activities: development of treatment plan with patient and/or surrogate as well as nursing, discussions with consultants, evaluation of patient's response to treatment, examination of patient, obtaining history from patient or surrogate, ordering and performing treatments and interventions, ordering and review of laboratory studies, ordering and review of radiographic studies, pulse oximetry and re-evaluation of patient's condition.  MDM  Discussed with [facility]. Will see in ED and admit to ICU. Will start on bipap and give lasix.   Loren Racer, MD 09/21/12 2209

## 2012-09-21 NOTE — ED Notes (Signed)
CRITICAL VALUE ALERT  Critical value received:  Troponin 2.57  Date of notification:  09/21/12  Time of notification:  22:21  Critical value read back: Yes  Nurse who received alert:  Alda Lea, RN  MD notified (1st page):  Dr. Ranae Palms   Time of first page:  22:21  Responding MD:  Dr. Ranae Palms  Time MD responded:  22:21

## 2012-09-22 ENCOUNTER — Encounter (HOSPITAL_COMMUNITY): Payer: Self-pay | Admitting: Internal Medicine

## 2012-09-22 DIAGNOSIS — I369 Nonrheumatic tricuspid valve disorder, unspecified: Secondary | ICD-10-CM

## 2012-09-22 LAB — HEPARIN LEVEL (UNFRACTIONATED): Heparin Unfractionated: <0.1 IU/mL — ABNORMAL LOW (ref 0.30–0.70)

## 2012-09-22 LAB — CK TOTAL AND CKMB (NOT AT ARMC)
CK, MB: 27.8 ng/mL (ref 0.3–4.0)
Relative Index: 0.1 (ref 0.0–2.5)
Total CK: 24576 U/L — ABNORMAL HIGH (ref 7–232)

## 2012-09-22 LAB — CBC
MCH: 30.5 pg (ref 26.0–34.0)
MCHC: 34.4 g/dL (ref 30.0–36.0)
MCV: 88.7 fL (ref 78.0–100.0)
MCV: 89.2 fL (ref 78.0–100.0)
Platelets: 257 10*3/uL (ref 150–400)
Platelets: 252 10*3/uL (ref 150–400)
RBC: 3.51 MIL/uL — ABNORMAL LOW (ref 4.22–5.81)
RDW: 13.1 % (ref 11.5–15.5)
WBC: 9.7 10*3/uL (ref 4.0–10.5)

## 2012-09-22 LAB — GLUCOSE, CAPILLARY
Glucose-Capillary: 131 mg/dL — ABNORMAL HIGH (ref 70–99)
Glucose-Capillary: 102 mg/dL — ABNORMAL HIGH (ref 70–99)
Glucose-Capillary: 125 mg/dL — ABNORMAL HIGH (ref 70–99)

## 2012-09-22 LAB — TROPONIN I: Troponin I: 1.87 ng/mL (ref <0.30)

## 2012-09-22 LAB — COMPREHENSIVE METABOLIC PANEL
ALT: 203 U/L — ABNORMAL HIGH (ref 0–53)
Calcium: 8.2 mg/dL — ABNORMAL LOW (ref 8.4–10.5)
Creatinine, Ser: 1.67 mg/dL — ABNORMAL HIGH (ref 0.50–1.35)
GFR calc Af Amer: 56 mL/min — ABNORMAL LOW (ref >90)
Glucose, Bld: 105 mg/dL — ABNORMAL HIGH (ref 70–99)
Sodium: 138 mEq/L (ref 135–145)
Total Protein: 5.8 g/dL — ABNORMAL LOW (ref 6.0–8.3)

## 2012-09-22 LAB — MRSA PCR SCREENING: MRSA by PCR: NEGATIVE

## 2012-09-22 MED ORDER — LORAZEPAM 2 MG/ML IJ SOLN: 0.5000 mg | INTRAMUSCULAR | Status: DC | PRN

## 2012-09-22 MED ORDER — HEPARIN (PORCINE) IN NACL 100-0.45 UNIT/ML-% IJ SOLN
1000.0000 [IU]/h | INTRAMUSCULAR | Status: DC
  Administered 2012-09-22: 1000 [IU]/h via INTRAVENOUS
  Filled 2012-09-22: qty 250

## 2012-09-22 MED ORDER — INSULIN ASPART 100 UNIT/ML ~~LOC~~ SOLN
0.0000 [IU] | Freq: Three times a day (TID) | SUBCUTANEOUS | Status: DC
  Administered 2012-09-22: 1 [IU] via SUBCUTANEOUS
  Administered 2012-09-26: 6 [IU] via SUBCUTANEOUS

## 2012-09-22 MED ORDER — ASPIRIN EC 81 MG PO TBEC
81.0000 mg | DELAYED_RELEASE_TABLET | Freq: Every day | ORAL | Status: DC
  Administered 2012-09-22 – 2012-09-26 (×5): 81 mg via ORAL
  Filled 2012-09-22 (×5): qty 1

## 2012-09-22 MED ORDER — SODIUM CHLORIDE 0.9 % IV SOLN: 250.0000 mL | INTRAVENOUS | Status: DC | PRN

## 2012-09-22 MED ORDER — SODIUM CHLORIDE 0.9 % IJ SOLN: 3.0000 mL | INTRAMUSCULAR | Status: DC | PRN

## 2012-09-22 MED ORDER — HEPARIN SODIUM (PORCINE) 5000 UNIT/ML IJ SOLN
5000.0000 [IU] | Freq: Three times a day (TID) | INTRAMUSCULAR | Status: DC
  Administered 2012-09-22 – 2012-09-26 (×10): 5000 [IU] via SUBCUTANEOUS
  Filled 2012-09-22 (×14): qty 1

## 2012-09-22 MED ORDER — ALPRAZOLAM 0.5 MG PO TABS
0.5000 mg | ORAL_TABLET | Freq: Three times a day (TID) | ORAL | Status: DC
  Administered 2012-09-22 – 2012-09-26 (×9): 0.5 mg via ORAL
  Filled 2012-09-22 (×10): qty 1

## 2012-09-22 MED ORDER — OXYCODONE HCL 5 MG PO TABS
5.0000 mg | ORAL_TABLET | ORAL | Status: DC | PRN
  Administered 2012-09-23: 10 mg via ORAL
  Administered 2012-09-23: 5 mg via ORAL
  Filled 2012-09-22: qty 2
  Filled 2012-09-22: qty 1

## 2012-09-22 MED ORDER — ASPIRIN EC 325 MG PO TBEC
325.0000 mg | DELAYED_RELEASE_TABLET | Freq: Every day | ORAL | Status: DC
Start: 2012-09-22 — End: 2012-09-22
  Filled 2012-09-22: qty 1

## 2012-09-22 MED ORDER — FUROSEMIDE 10 MG/ML IJ SOLN
20.0000 mg | Freq: Two times a day (BID) | INTRAMUSCULAR | Status: DC
  Administered 2012-09-22: 20 mg via INTRAVENOUS
  Filled 2012-09-22 (×2): qty 2

## 2012-09-22 MED ORDER — POTASSIUM CHLORIDE CRYS ER 20 MEQ PO TBCR
40.0000 meq | EXTENDED_RELEASE_TABLET | ORAL | Status: AC
  Administered 2012-09-22 (×2): 40 meq via ORAL
  Filled 2012-09-22 (×2): qty 2

## 2012-09-22 MED ORDER — HEPARIN BOLUS VIA INFUSION
4000.0000 [IU] | Freq: Once | INTRAVENOUS | Status: AC
  Administered 2012-09-22: 4000 [IU] via INTRAVENOUS
  Filled 2012-09-22: qty 4000

## 2012-09-22 MED ORDER — ACETAMINOPHEN 325 MG PO TABS: 650.0000 mg | ORAL_TABLET | Freq: Four times a day (QID) | ORAL | Status: DC | PRN

## 2012-09-22 MED ORDER — SODIUM CHLORIDE 0.9 % IJ SOLN
3.0000 mL | Freq: Two times a day (BID) | INTRAMUSCULAR | Status: DC
  Administered 2012-09-22: 3 mL via INTRAVENOUS

## 2012-09-22 NOTE — Progress Notes (Signed)
  Echocardiogram 2D Echocardiogram has been performed.  Bradley Day FRANCES 09/22/2012, 12:53 PM

## 2012-09-22 NOTE — Progress Notes (Signed)
Nutrition Brief Note  Patient identified on the Malnutrition Screening Tool (MST) Report for recent weight loss without trying.  Per readings below, patient's weight has increased.  Wt Readings from Last 10 Encounters:  09/21/12 180 lb (81.647 kg)  02/12/12 170 lb (77.111 kg)    Body mass index is 23.75 kg/(m^2). Patient meets criteria for Normal based on current BMI.   Current diet order is Heart Healthy; consumed 100% of breakfast this AM.  Labs and medications reviewed.   No nutrition interventions warranted at this time. If nutrition issues arise, please consult RD.   Patient reported having difficulty with liquids; feels he gets "choked;" consider swallow evaluation.  Maureen Chatters, RD, LDN Pager #: [phone] After-Hours Pager #: [phone]

## 2012-09-22 NOTE — Progress Notes (Signed)
Patient  Arrived from 2600 Alert and oriented x4, No distress. Patient  Skin intact with tatoo present. Patient with foley cath present and in place.  Patient oriented to unit  And protocol. Patient made of Fall safety plan and is agreeable. No question or concerns. Bradley Day

## 2012-09-22 NOTE — Progress Notes (Signed)
TELEMETRY: Reviewed telemetry pt in : SR, no significant ectopy Filed Vitals:   09/22/12 0900 09/22/12 1000 09/22/12 1100 09/22/12 1202  BP: 106/70 101/65 97/64 98/59   Pulse: 96   103  Temp:    97.7 F (36.5 C)  TempSrc:    Oral  Resp: 21 15 16 17   Height:      Weight:      SpO2: 100%   100%    Intake/Output Summary (Last 24 hours) at 09/22/12 1240 Last data filed at 09/22/12 1203  Gross per 24 hour  Intake    363 ml  Output   6050 ml  Net  -5687 ml    SUBJECTIVE No chest pain, doesn't feel that bad generally.   LABS: Basic Metabolic Panel:  Recent Labs  16/10/96 2124 09/22/12 0425  NA 134* 138  K 3.2* 3.4*  CL 94* 96  CO2 32 32  GLUCOSE 117* 105*  BUN 12 13  CREATININE 1.80* 1.67*  CALCIUM 8.1* 8.2*   Liver Function Tests:  Recent Labs  09/21/12 2124 09/22/12 0425  AST 621* 520*  ALT 213* 203*  ALKPHOS 82 82  BILITOT 0.3 0.4  PROT 6.3 5.8*  ALBUMIN 2.9* 2.7*   CBC:  Recent Labs  09/21/12 2124 09/22/12 0425 09/22/12 0820  WBC 12.5* 12.4* 9.7  NEUTROABS 9.7*  --   --   HGB 10.7* 11.1* 10.8*  HCT 31.4* 32.3* 31.3*  MCV 89.2 88.7 89.2  PLT 235 257 252   Cardiac Enzymes:  Recent Labs  09/21/12 2124 09/22/12 0130 09/22/12 0425 09/22/12 0820  CKTOTAL >20000*  --  04540* 24576*  CKMB  --   --   --  27.8*  TROPONINI 2.57* 1.87*  --  1.17*    Radiology/Studies:  Dg Chest Port 1 View 09/21/2012   *RADIOLOGY REPORT*  Clinical Data: Hypoxia, COPD  PORTABLE CHEST - 1 VIEW  Comparison: 02/12/2012  Findings: Diffuse interstitial edema pattern throughout the lungs compatible with CHF.  Bibasilar atelectasis also evident.  No large effusion or pneumothorax.  Trachea midline.  IMPRESSION: Mild diffuse interstitial edema and basilar atelectasis.   Original Report Authenticated By: Judie Petit. Miles Costain, M.D.     PHYSICAL EXAM General: Well developed, well nourished, male in no acute distress. Head: Normocephalic, atraumatic, sclera non-icteric, no xanthomas,  nares are without discharge. Neck: Negative for carotid bruits. JVD not elevated. Lungs: Clear bilaterally to auscultation without wheezes, rales, few rhonchi. Breathing is unlabored. Heart: RRR S1 S2 without murmurs, rubs, or gallops.  Abdomen: Soft, non-tender, non-distended with normoactive bowel sounds. No hepatomegaly. No rebound/guarding. No obvious abdominal masses. Msk:  Strength and tone appears normal for age. Extremities: No clubbing, cyanosis or edema.  Distal pedal pulses are 2+ and equal bilaterally. Neuro: Alert and oriented X 3. Moves all extremities spontaneously. Psych:  Responds to questions appropriately with a normal affect.  Scheduled Meds: . aspirin EC  81 mg Oral Daily  . insulin aspart  0-9 Units Subcutaneous TID WC  . sodium chloride  3 mL Intravenous Q12H    ASSESSMENT AND PLAN:  Active Problems:   Rhabdomyolysis   AKI (acute kidney injury)    NSTEMI (non-ST elevated myocardial infarction) - elevated cardiac enzymes but in the setting of CK > 20,000. MB fraction now back and index is very low. Echo results pending. No ischemic symptoms. Continue to follow. MD    Urinary retention   Drug overdose, multiple drugs   Transaminitis   Hypokalemia   Diabetes mellitus  Chronic back pain   Otherwise, per primary MD. Signed, Theodore Demark, PA-C  Patient seen and examined and history reviewed. Agree with above findings and plan. Patient denies any chest pain. Major complaint is sore throat. Patient has cardiac risk factors of DM, tobacco abuse and family history of CAD. Presentation is primarily one of rhabdo. Markedly elevated CK with mildly elevated troponin. LFTs also elevated. Ecg without acute ST-T changes. Echo shows global hypokinesis with overall EF approx. 45%. This is consistent with demand situation from rhabdo, respiratory failure, urinary obstruction, and ARF. He is a poor candidate for invasive evaluation at this point. When his clinical status  improves I would consider a Lexiscan myoview study to further stratify risk. Avoid ACEi/ARB now due to ARF. Consider low dose beta blocker. Good response to diuretics yesterday- appears fairly euvolemic now. Will follow.  Theron Arista [facility]  09/22/2012 4:11 PM

## 2012-09-22 NOTE — Progress Notes (Signed)
[facility] Progress Note [place] TEAM 1 - Stepdown/ICU TEAM   Bradley Day BJY:[id number] DOB: [birth year] DOA: 09/21/2012 PCP: No primary provider on file.  Brief narrative: 41 year old male patient with known history of bipolar disorder. Recently on Xarelto for pulmonary embolism but did complete a six-month course of treatment. Patient also has chronic back pain and is on chronic opiates managed by a pain physician in [place], [place]. He originally presented to the [facility] on 09/20/2012 after being found unresponsive by his mother. He taken his usual pain medications as prescribed but had recently been started on amitriptyline. Patient did require intubation in the field. After awakening in the emergency department at the [facility] he decided to leave AGAINST MEDICAL ADVICE after staff they are told him they felt he was oversedated because he had intentionally overdosed in an attempt to commit suicide. Patient adamantly denied this. Because he still felt poorly his mother then drove him to regional [place] where he was evaluated at [facility]. He was found to be hypoxic with O2 sats in the 80s. He had urinary retention with a bladder scan demonstrating 600 cc of urine. Additional emergency department evaluation revealed troponin of 2.57 with a total CPK of 23,887 and a CK-MB of 173. EKG but was without ischemic changes. Because of concern over need for possible extensive cardiac evaluation the patient was subsequently transferred to [facility] in [place].  Assessment/Plan:    Rhabdomyolysis -multifactorial: primarily medication related from pre admit Metformin in setting of oversedation from usual pain meds and prolonged immobility without movement x 2.5 hours (completely immobile per pt mom) -CPK slow trending down  -resume IVF at rate of 125/hr -would NOT resume Metformin upon dc    AKI (acute kidney injury) -due to  rhabdo -urinary retention was transient and 2/2 narcs -renal function slowly improving -follow lytes    ?NSTEMI - doubt  -Cards following -they suspect mild demand ischemia-if ECHO shows RWMA then they will pursue cath vs Myoview -TNI is trending down and appears solely due to ARF/rhabdomyolosis -ECHO pending -dc Heparin gtt   Acute respiratory failure with hypoxia and mild hypercarbia in known COPD -has been smoking since age 21 -CXR and ABG c/w COPD -cont supportive care    Diabetes mellitus -was on Metformin pre admit- would stop PERMANENTLY given presentation with rhabdomyolosis -check HgbA1c to guide future therapy: insulin vs other OHA   Laryngitis -onset after intubation -RN says pt coughs/chokes with attempts to swallow so will ask SLP to eval    Chronic back pain -cautious use of narcs and neuropathic agents    Transaminitis -likely due to low perfusion and possible hypotension pre admit and rhabdo -repeat to follow trends    Hypokalemia -replete prn   DVT prophylaxis: sub Q heparin Code Status: Full Family Communication: Patient and mother at bedside Disposition Plan/Expected LOS: Transfer to medical floor. Additional workup in process regarding eventual endpoint for diabetes treatment as well as issues related to choking and coughing while swallowing post intubation.  Consultants: Cardiology  Procedures: 2-D echocardiogram - pending  Antibiotics: None  HPI/Subjective: Patient alert and primarily complains of sore throat and laryngitis. No chest pain or shortness of breath at present   Objective: Blood pressure 98/59, pulse 103, temperature 97.7 F (36.5 C), temperature source Oral, resp. rate 17, height 6\' 1"  (1.854 m), weight 81.647 kg (180 lb), SpO2 100.00%.  Intake/Output Summary (Last 24 hours) at 09/22/12 1314 Last data filed at 09/22/12  1203  Gross per 24 hour  Intake    363 ml  Output   6050 ml  Net  -5687 ml     Exam: General: No  acute respiratory distress Lungs: Clear to auscultation bilaterally without wheezes or crackles, RA Cardiovascular: Regular rate and rhythm without murmur gallop or rub normal S1 and S2, no peripheral edema or JVD Abdomen: Nontender, nondistended, soft, bowel sounds positive, no rebound, no ascites, no appreciable mass Musculoskeletal: No significant cyanosis, clubbing of bilateral lower extremities Neurological: Alert and oriented x 3, moves all extremities x 4 without focal neurological deficits, CN 2-12 intact  Scheduled Meds: Scheduled Meds: . aspirin EC  81 mg Oral Daily  . insulin aspart  0-9 Units Subcutaneous TID WC  . sodium chloride  3 mL Intravenous Q12H    Data Reviewed: Basic Metabolic Panel:  Recent Labs Lab 09/21/12 2124 09/22/12 0425  NA 134* 138  K 3.2* 3.4*  CL 94* 96  CO2 32 32  GLUCOSE 117* 105*  BUN 12 13  CREATININE 1.80* 1.67*  CALCIUM 8.1* 8.2*   Liver Function Tests:  Recent Labs Lab 09/21/12 2124 09/22/12 0425  AST 621* 520*  ALT 213* 203*  ALKPHOS 82 82  BILITOT 0.3 0.4  PROT 6.3 5.8*  ALBUMIN 2.9* 2.7*   CBC:  Recent Labs Lab 09/21/12 2124 09/22/12 0425 09/22/12 0820  WBC 12.5* 12.4* 9.7  NEUTROABS 9.7*  --   --   HGB 10.7* 11.1* 10.8*  HCT 31.4* 32.3* 31.3*  MCV 89.2 88.7 89.2  PLT 235 257 252   Cardiac Enzymes:  Recent Labs Lab 09/21/12 2124 09/22/12 0130 09/22/12 0425 09/22/12 0820  CKTOTAL >20000*  --  52841* 24576*  CKMB  --   --   --  27.8*  TROPONINI 2.57* 1.87*  --  1.17*   BNP (last 3 results)  Recent Labs  09/21/12 2124  PROBNP 3389.0*   CBG:  Recent Labs Lab 09/22/12 0749 09/22/12 1122  GLUCAP 131* 112*    Recent Results (from the past 240 hour(s))  CULTURE, BLOOD (ROUTINE X 2)     Status: None   Collection Time    09/21/12  9:24 PM      Result Value Range Status   Specimen Description LEFT ANTECUBITAL   Final   Special Requests BOTTLES DRAWN AEROBIC AND ANAEROBIC 8CC EACH   Final    Culture NO GROWTH 1 DAY   Final   Report Status PENDING   Incomplete  CULTURE, BLOOD (ROUTINE X 2)     Status: None   Collection Time    09/21/12  9:30 PM      Result Value Range Status   Specimen Description BLOOD RIGHT HAND   Final   Special Requests BOTTLES DRAWN AEROBIC AND ANAEROBIC 6CC EACH   Final   Culture NO GROWTH 1 DAY   Final   Report Status PENDING   Incomplete  MRSA PCR SCREENING     Status: None   Collection Time    09/22/12  1:41 AM      Result Value Range Status   MRSA by PCR NEGATIVE  NEGATIVE Final   Comment:            The GeneXpert MRSA Assay (FDA     approved for NASAL specimens     only), is one component of a     comprehensive MRSA colonization     surveillance program. It is not     intended to diagnose  MRSA     infection nor to guide or     monitor treatment for     MRSA infections.     Studies:  Recent x-ray studies have been reviewed in detail by the Attending Physician    Junious Silk, ANP [facility] Office  [phone] Pager [phone]  **If unable to reach the above provider after paging please contact the Flow Manager @ [phone]  On-Call/Text Page:      [email or web address]      password [email or web address]  If 7PM-7AM, please contact night-coverage [email or web address] Password [email or web address] 09/22/2012, 1:14 PM   LOS: 1 day   I have personally examined this patient and reviewed the entire database. I have reviewed the above note, made any necessary editorial changes, and agree with its content.  Lonia Blood, MD [facility]

## 2012-09-22 NOTE — Progress Notes (Signed)
Utilization Review Completed.  

## 2012-09-22 NOTE — Progress Notes (Signed)
ANTICOAGULATION CONSULT NOTE - Initial Consult  Pharmacy Consult for heparin Indication: chest pain/ACS  Allergies  Allergen Reactions  . Augmentin (Amoxicillin-Pot Clavulanate)     unknown  . Toradol (Ketorolac Tromethamine)     Severe headaches     Patient Measurements: Height: 6\' 1"  (185.4 cm) Weight: 180 lb (81.647 kg) IBW/kg (Calculated) : 79.9  Vital Signs: Temp: 100 F (37.8 C) (08/03 2031) Temp src: Oral (08/03 2031) BP: 128/65 mmHg (08/03 2343) Pulse Rate: 118 (08/03 2343)  Labs:  Recent Labs  09/21/12 2124  HGB 10.7*  HCT 31.4*  PLT 235  CREATININE 1.80*  CKTOTAL >20000*  TROPONINI 2.57*    Estimated Creatinine Clearance: 59.8 ml/min (by C-G formula based on Cr of 1.8).   Medical History: Past Medical History  Diagnosis Date  . Emphysema   . Depression   . Bipolar 1 disorder   . Pulmonary embolism August 2013    off anticoagulation 12/2011 per pt  . IV drug user     last IV drug use in 2011    Medications:  Prescriptions prior to admission  Medication Sig Dispense Refill  . albuterol (PROVENTIL HFA;VENTOLIN HFA) 108 (90 BASE) MCG/ACT inhaler Inhale 2 puffs into the lungs every 6 (six) hours as needed for wheezing or shortness of breath.      . ALPRAZolam (XANAX) 0.5 MG tablet Take 0.5 mg by mouth 3 (three) times daily.      Marland Kitchen amitriptyline (ELAVIL) 150 MG tablet Take 150 mg by mouth at bedtime.      . gabapentin (NEURONTIN) 800 MG tablet Take 800 mg by mouth 3 (three) times daily.      . metFORMIN (GLUCOPHAGE) 500 MG tablet Take 500 mg by mouth 2 (two) times daily.      Marland Kitchen omeprazole (PRILOSEC) 20 MG capsule Take 20 mg by mouth daily.      Marland Kitchen oxycodone (ROXICODONE) 30 MG immediate release tablet Take 30 mg by mouth 6 (six) times daily.      Marland Kitchen tiotropium (SPIRIVA) 18 MCG inhalation capsule Place 18 mcg into inhaler and inhale daily.       Scheduled:  . aspirin EC  325 mg Oral Daily  . furosemide  20 mg Intravenous Q12H  . insulin aspart  0-9  Units Subcutaneous TID WC  . potassium chloride  40 mEq Oral Q4H  . sodium chloride  3 mL Intravenous Q12H    Assessment: 42yo male c/o unsteady gait and numbness of hand, signed out AMA from [facility], returned via EMS for apnea and unresponsiveness, possible suicide attempt, now tx'd to [facility] for cardiac workup given elevated troponin, to begin heparin.  Goal of Therapy:  Heparin level 0.3-0.7 units/ml Monitor platelets by anticoagulation protocol: Yes   Plan:  Will give heparin 4000 units IV bolus x1 followed by gtt at 1000 units/hr and monitor heparin levels and CBC.  Vernard Gambles, PharmD, BCPS  09/22/2012,1:22 AM

## 2012-09-23 ENCOUNTER — Inpatient Hospital Stay (HOSPITAL_COMMUNITY): Payer: Self-pay

## 2012-09-23 LAB — CBC
Platelets: 286 10*3/uL (ref 150–400)
RBC: 4.08 MIL/uL — ABNORMAL LOW (ref 4.22–5.81)
RDW: 12.5 % (ref 11.5–15.5)
WBC: 8.5 10*3/uL (ref 4.0–10.5)

## 2012-09-23 LAB — COMPREHENSIVE METABOLIC PANEL
Alkaline Phosphatase: 85 U/L (ref 39–117)
BUN: 11 mg/dL (ref 6–23)
Chloride: 98 mEq/L (ref 96–112)
Creatinine, Ser: 1.26 mg/dL (ref 0.50–1.35)
GFR calc Af Amer: 79 mL/min — ABNORMAL LOW (ref >90)
Glucose, Bld: 105 mg/dL — ABNORMAL HIGH (ref 70–99)
Potassium: 4.1 mEq/L (ref 3.5–5.1)
Total Bilirubin: 0.4 mg/dL (ref 0.3–1.2)
Total Protein: 6.2 g/dL (ref 6.0–8.3)

## 2012-09-23 LAB — GLUCOSE, CAPILLARY
Glucose-Capillary: 108 mg/dL — ABNORMAL HIGH (ref 70–99)
Glucose-Capillary: 102 mg/dL — ABNORMAL HIGH (ref 70–99)
Glucose-Capillary: 108 mg/dL — ABNORMAL HIGH (ref 70–99)

## 2012-09-23 LAB — URINE CULTURE: Culture: NO GROWTH

## 2012-09-23 LAB — HEMOGLOBIN A1C: Hgb A1c MFr Bld: 5.7 % — ABNORMAL HIGH (ref <5.7)

## 2012-09-23 MED ORDER — OXYCODONE HCL 5 MG PO TABS
15.0000 mg | ORAL_TABLET | ORAL | Status: DC | PRN
  Administered 2012-09-23 – 2012-09-25 (×8): 15 mg via ORAL
  Filled 2012-09-23 (×8): qty 3

## 2012-09-23 MED ORDER — RESOURCE THICKENUP CLEAR PO POWD
ORAL | Status: DC | PRN
  Filled 2012-09-23: qty 125

## 2012-09-23 MED ORDER — SODIUM CHLORIDE 0.9 % IV SOLN: 250.0000 mL | INTRAVENOUS | Status: DC | PRN

## 2012-09-23 MED ORDER — PHENOL 1.4 % MT LIQD
1.0000 | OROMUCOSAL | Status: DC | PRN
Start: 2012-09-23 — End: 2012-09-26
  Administered 2012-09-23: 1 via OROMUCOSAL
  Filled 2012-09-23: qty 177

## 2012-09-23 MED ORDER — NICOTINE 14 MG/24HR TD PT24
14.0000 mg | MEDICATED_PATCH | Freq: Every day | TRANSDERMAL | Status: DC
  Administered 2012-09-23 – 2012-09-25 (×3): 14 mg via TRANSDERMAL
  Filled 2012-09-23 (×4): qty 1

## 2012-09-23 NOTE — Care Management Note (Addendum)
    Page 1 of 2   09/26/2012     3:04:24 PM   CARE MANAGEMENT NOTE 09/26/2012  Patient:  Bradley Day, Bradley Day   Account Number:  [id number]  Date Initiated:  09/23/2012  Documentation initiated by:  Letha Cape  Subjective/Objective Assessment:   dx rhabdomyolysis, urinary retention  admit- lives alone.  pta indep.     Action/Plan:   stress test when stable  increase pain meds,  pt eval- no pt f/u needed.   Anticipated DC Date:  09/26/2012   Anticipated DC Plan:  HOME/SELF CARE      DC Planning Services  CM consult  MATCH Program      Choice offered to / List presented to:             Status of service:  Completed, signed off Medicare Important Message given?   (If response is "NO", the following Medicare IM given date fields will be blank) Date Medicare IM given:   Date Additional Medicare IM given:    Discharge Disposition:  HOME/SELF CARE  Per UR Regulation:  Reviewed for med. necessity/level of care/duration of stay  If discussed at Long Length of Stay Meetings, dates discussed:    Comments:  PCP Bing Plume in [place]  09/26/12 14:58 Letha Cape RN, BSN [phone] patient is for dc to home today, mother is patient's transportation and he has the Match letter for meds, per physical therapy no pt f/u needed.  09/25/12 12:01 Letha Cape RN, BSN [phone] patient for possible discharge today, he has transportation, but need ast with medications, gave him the Match Program letter to get mes for $3 each.  Per physical therapy he has no pt needs.  09/23/12 11:21 Letha Cape RN, BSN [phone] patient lives alone, pta indep.  Pateint states he will need ast with medications, will try to ast with the Match Program, explained he can only use it once.  Patient has transportation at dc.  Patient will need a stress test when stable per MD then will dc.

## 2012-09-23 NOTE — Progress Notes (Addendum)
Filed Vitals:   09/22/12 1450 09/22/12 1515 09/22/12 2100 09/23/12 0500  BP: 124/69  116/73 146/75  Pulse: 97  100 101  Temp: 97.4 F (36.3 C)  98.1 F (36.7 C) 98.6 F (37 C)  TempSrc: Oral  Oral Oral  Resp: 18  20 20   Height:      Weight:      SpO2: 92% 96% 91% 100%    Intake/Output Summary (Last 24 hours) at 09/23/12 1055 Last data filed at 09/23/12 0500  Gross per 24 hour  Intake    240 ml  Output   4200 ml  Net  -3960 ml    SUBJECTIVE No chest pain, still has sore throat. Continues to complain of severe back pain.  LABS: Basic Metabolic Panel:  Recent Labs  16/10/96 0425 09/23/12 0520  NA 138 138  K 3.4* 4.1  CL 96 98  CO2 32 30  GLUCOSE 105* 105*  BUN 13 11  CREATININE 1.67* 1.26  CALCIUM 8.2* 9.2   Liver Function Tests:  Recent Labs  09/22/12 0425 09/23/12 0520  AST 520* 305*  ALT 203* 172*  ALKPHOS 82 85  BILITOT 0.4 0.4  PROT 5.8* 6.2  ALBUMIN 2.7* 2.7*   CBC:  Recent Labs  09/21/12 2124  09/22/12 0820 09/23/12 0520  WBC 12.5*  < > 9.7 8.5  NEUTROABS 9.7*  --   --   --   HGB 10.7*  < > 10.8* 12.5*  HCT 31.4*  < > 31.3* 36.0*  MCV 89.2  < > 89.2 88.2  PLT 235  < > 252 286  < > = values in this interval not displayed. Cardiac Enzymes:  Recent Labs  09/21/12 2124 09/22/12 0130 09/22/12 0425 09/22/12 0820 09/23/12 0520  CKTOTAL >20000*  --  04540* 98119* 10815*  CKMB  --   --   --  27.8*  --   TROPONINI 2.57* 1.87*  --  1.17*  --     Radiology/Studies:  Dg Chest Port 1 View 09/21/2012   *RADIOLOGY REPORT*  Clinical Data: Hypoxia, COPD  PORTABLE CHEST - 1 VIEW  Comparison: 02/12/2012  Findings: Diffuse interstitial edema pattern throughout the lungs compatible with CHF.  Bibasilar atelectasis also evident.  No large effusion or pneumothorax.  Trachea midline.  IMPRESSION: Mild diffuse interstitial edema and basilar atelectasis.   Original Report Authenticated By: Judie Petit. Miles Costain, M.D.     PHYSICAL EXAM General: Well developed,  well nourished, male in no acute distress. Head: Normocephalic, atraumatic, sclera non-icteric, no xanthomas, nares are without discharge. Neck: Negative for carotid bruits. JVD not elevated. Lungs: Clear bilaterally to auscultation without wheezes, rales, few rhonchi. Breathing is unlabored. Heart: RRR S1 S2 without murmurs, rubs, or gallops.  Abdomen: Soft, non-tender, non-distended with normoactive bowel sounds. No hepatomegaly. No rebound/guarding. No obvious abdominal masses. Msk:  Strength and tone appears normal for age. Extremities: No clubbing, cyanosis or edema.  Distal pedal pulses are 2+ and equal bilaterally. Neuro: Alert and oriented X 3. Moves all extremities spontaneously. Psych:  Responds to questions appropriately with a normal affect.  Scheduled Meds: . ALPRAZolam  0.5 mg Oral TID  . aspirin EC  81 mg Oral Daily  . heparin subcutaneous  5,000 Units Subcutaneous Q8H  . insulin aspart  0-9 Units Subcutaneous TID WC    ASSESSMENT AND PLAN:  Active Problems:   Rhabdomyolysis   AKI (acute kidney injury)    NSTEMI (non-ST elevated myocardial infarction) - elevated cardiac enzymes but in  the setting of CK > 20,000. MB fraction now back and index is very low. Echo shows global hypokinesis with EF 45%. No ischemic symptoms.     Urinary retention   Drug overdose, multiple drugs   Transaminitis   Hypokalemia   Diabetes mellitus   Chronic back pain   Patient has cardiac risk factors of DM, tobacco abuse and family history of CAD. Presentation is primarily one of rhabdo. Markedly elevated CK with mildly elevated troponin. LFTs also elevated. Ecg without acute ST-T changes. Echo shows global hypokinesis with overall EF approx. 45%. This is consistent with demand situation from rhabdo, respiratory failure, urinary obstruction, and ARF. Elevated CK and LFTs improving. Renal function better.He is a poor candidate for invasive evaluation at this point. I recommend a  Lexiscan  myoview study to further stratify risk. Will tentatively schedule for tomorrow am. Avoid ACEi/ARB now due to ARF. Appears fairly euvolemic now.   Bradley Day [facility]  09/23/2012 10:55 AM   Myoview study reviewed. Not optimal due to poor counts. No ischemia noted. EF 37%. I reviewed Echo personally and I think his EF by Echo is also in the 40% range. Based on his myoview it appears this is nonischemic. Will start low dose beta blocker. Avoid ACEi with recent ARF but could consider in 2-3 weeks if renal function recovers. Will need to watch BP closely since it tends to run low.  Srihaan Mastrangelo [place] MD, [facility]

## 2012-09-23 NOTE — Progress Notes (Signed)
PATIENT DETAILS Name: Bradley Day Age: 42 y.o. Sex: male Date of Birth: 02-Jan-1971 Admit Date: 09/21/2012 Admitting Physician Meredeth Ide, MD PCP:No primary provider on file.  Brief narrative:  42 year old male patient with known history of bipolar disorder. Recently on Xarelto for pulmonary embolism but did complete a six-month course of treatment. Patient also has chronic back pain and is on chronic opiates managed by a pain physician in [place], [place]. He originally presented to the [facility] on 09/20/2012 after being found unresponsive by his mother. He taken his usual pain medications as prescribed but had recently been started on amitriptyline. Patient did require intubation in the field. After awakening in the emergency department at the [facility] he decided to leave AGAINST MEDICAL ADVICE after staff they are told him they felt he was oversedated because he had intentionally overdosed in an attempt to commit suicide. Patient adamantly denied this. Because he still felt poorly his mother then drove him to regional [place] where he was evaluated at [facility]. He was found to be hypoxic with O2 sats in the 80s. He had urinary retention with a bladder scan demonstrating 600 cc of urine. Additional emergency department evaluation revealed troponin of 2.57 with a total CPK of 23,887 and a CK-MB of 173. EKG but was without ischemic changes. Because of concern over need for possible extensive cardiac evaluation the patient was subsequently transferred to [facility] in [place].   Subjective: This AM he complains of throat soreness and hoarseness from the intubation as well as lower back pain which he states is chronic.  He controls the lower back pain with oxycodone, however, he states the dose he's been given since he's been here is not enough because he takes more "on the street".  Assessment/Plan: Rhabdomyolysis  -multifactorial:  primarily medication related from pre admit Metformin in setting of oversedation from usual pain meds and prolonged immobility without movement x 2.5 hours (completely immobile per pt mom)  -CPK slow trending down  -resume IVF at rate of 125/hr  -would NOT resume Metformin upon dc   AKI (acute kidney injury)  -due to rhabdo  -urinary retention was transient and 2/2 narcs  -renal function slowly improving  -follow lytes   ?NSTEMI - doubt  -Cards following  -they suspect mild demand ischemia-if ECHO shows RWMA then they will pursue cath vs Myoview  -TNI is trending down and appears solely due to ARF/rhabdomyolosis  -ECHO shows grade II diastolic dysfunction with an ejection fraction of 45-50%  Acute respiratory failure with hypoxia and mild hypercarbia in known COPD  -has been smoking since age 43  -CXR and ABG c/w COPD  -cont supportive care   Diabetes mellitus  -was on Metformin pre admit- would stop PERMANENTLY given presentation with rhabdomyolosis  -HgbA1C is 5.7 (09/22/12)  Laryngitis  -onset after intubation  -RN says pt coughs/chokes with attempts to swallow so will ask SLP to eval  -prn Chloraseptic spray-see how he does-may need ENT eval if persistent  Chronic back pain  -cautious use of narcs and neuropathic agents   Transaminitis  -likely due to low perfusion and possible hypotension pre admit and rhabdo  -repeat to follow trends   Hypokalemia  -replete prn  Disposition: Remain inpatient  DVT Prophylaxis: Prophylactic Heparin  Code Status: Full code  Family Communication None at bedside  Procedures:  None  CONSULTS:  None   MEDICATIONS: Scheduled Meds: . ALPRAZolam  0.5 mg Oral TID  . aspirin  EC  81 mg Oral Daily  . heparin subcutaneous  5,000 Units Subcutaneous Q8H  . insulin aspart  0-9 Units Subcutaneous TID WC   Continuous Infusions:  PRN Meds:.sodium chloride, acetaminophen, oxyCODONE  Antibiotics: Anti-infectives   None        PHYSICAL EXAM: Vital signs in last 24 hours: Filed Vitals:   09/22/12 1450 09/22/12 1515 09/22/12 2100 09/23/12 0500  BP: 124/69  116/73 146/75  Pulse: 97  100 101  Temp: 97.4 F (36.3 C)  98.1 F (36.7 C) 98.6 F (37 C)  TempSrc: Oral  Oral Oral  Resp: 18  20 20   Height:      Weight:      SpO2: 92% 96% 91% 100%    Weight change:  Filed Weights   09/21/12 2031  Weight: 81.647 kg (180 lb)   Body mass index is 23.75 kg/(m^2).   Gen Exam: Awake and alert with clear speech.  NAD Neck: Supple, No JVD. Chest: B/L Clear. CVS: S1 S2 Regular, no murmurs. Abdomen: soft, BS +, non tender, non distended. Extremities: no edema, lower extremities warm to touch. Neurologic: Non Focal. Skin: No Rash.  Intake/Output from previous day:  Intake/Output Summary (Last 24 hours) at 09/23/12 0854 Last data filed at 09/23/12 0500  Gross per 24 hour  Intake    243 ml  Output   4200 ml  Net  -3957 ml     LAB RESULTS: CBC  Recent Labs Lab 09/21/12 2124 09/22/12 0425 09/22/12 0820 09/23/12 0520  WBC 12.5* 12.4* 9.7 8.5  HGB 10.7* 11.1* 10.8* 12.5*  HCT 31.4* 32.3* 31.3* 36.0*  PLT 235 257 252 286  MCV 89.2 88.7 89.2 88.2  MCH 30.4 30.5 30.8 30.6  MCHC 34.1 34.4 34.5 34.7  RDW 13.0 13.0 13.1 12.5  LYMPHSABS 1.5  --   --   --   MONOABS 1.2*  --   --   --   EOSABS 0.1  --   --   --   BASOSABS 0.0  --   --   --     Chemistries   Recent Labs Lab 09/21/12 2124 09/22/12 0425 09/23/12 0520  NA 134* 138 138  K 3.2* 3.4* 4.1  CL 94* 96 98  CO2 32 32 30  GLUCOSE 117* 105* 105*  BUN 12 13 11   CREATININE 1.80* 1.67* 1.26  CALCIUM 8.1* 8.2* 9.2    CBG:  Recent Labs Lab 09/22/12 0749 09/22/12 1122 09/22/12 1740 09/22/12 2122 09/23/12 0737  GLUCAP 131* 112* 102* 125* 108*    GFR Estimated Creatinine Clearance: 85.4 ml/min (by C-G formula based on Cr of 1.26).  Cardiac Enzymes  Recent Labs Lab 09/21/12 2124 09/22/12 0130 09/22/12 0820  CKMB  --   --   27.8*  TROPONINI 2.57* 1.87* 1.17*    Recent Labs  09/22/12 1458  HGBA1C 5.7*    MICROBIOLOGY: Recent Results (from the past 240 hour(s))  CULTURE, BLOOD (ROUTINE X 2)     Status: None   Collection Time    09/21/12  9:24 PM      Result Value Range Status   Specimen Description LEFT ANTECUBITAL   Final   Special Requests BOTTLES DRAWN AEROBIC AND ANAEROBIC 8CC EACH   Final   Culture NO GROWTH 1 DAY   Final   Report Status PENDING   Incomplete  CULTURE, BLOOD (ROUTINE X 2)     Status: None   Collection Time    09/21/12  9:30 PM  Result Value Range Status   Specimen Description BLOOD RIGHT HAND   Final   Special Requests BOTTLES DRAWN AEROBIC AND ANAEROBIC 6CC EACH   Final   Culture NO GROWTH 1 DAY   Final   Report Status PENDING   Incomplete  URINE CULTURE     Status: None   Collection Time    09/21/12 11:04 PM      Result Value Range Status   Specimen Description URINE, CATHETERIZED   Final   Special Requests NONE   Final   Culture  Setup Time     Final   Value: 09/22/2012 04:40     Performed at [facility] Count     Final   Value: NO GROWTH     Performed at [facility]   Culture     Final   Value: NO GROWTH     Performed at [facility]   Report Status 09/23/2012 FINAL   Final  MRSA PCR SCREENING     Status: None   Collection Time    09/22/12  1:41 AM      Result Value Range Status   MRSA by PCR NEGATIVE  NEGATIVE Final   Comment:            The GeneXpert MRSA Assay (FDA     approved for NASAL specimens     only), is one component of a     comprehensive MRSA colonization     surveillance program. It is not     intended to diagnose MRSA     infection nor to guide or     monitor treatment for     MRSA infections.    RADIOLOGY STUDIES/RESULTS: Dg Chest Port 1 View  09/21/2012   *RADIOLOGY REPORT*  Clinical Data: Hypoxia, COPD  PORTABLE CHEST - 1 VIEW  Comparison: 02/12/2012  Findings: Diffuse interstitial edema pattern  throughout the lungs compatible with CHF.  Bibasilar atelectasis also evident.  No large effusion or pneumothorax.  Trachea midline.  IMPRESSION: Mild diffuse interstitial edema and basilar atelectasis.   Original Report Authenticated By: Judie Petit. Miles Costain, M.D.    Rema Fendt, PA-S  [facility]   If 7PM-7AM, please contact night-coverage [email or web address] Password [email or web address] 09/23/2012, 8:54 AM   LOS: 2 days   Attending Patient seen and examined, agree with the above assessment and plan. Continue with current care, appreciate cards follow up, Nuclear Stress test when more stable. CK and creatinine down trending. Will increase Oxycodone to 15 mg-as he claims pain is uncontrolled. Rest as above  Windell Norfolk MD

## 2012-09-23 NOTE — Evaluation (Signed)
Clinical/Bedside Swallow Evaluation Patient Details  Name: Bradley Day MRN: [id number] Date of Birth: [birth year]  Today's Date: 09/23/2012 Time: 0945-     Past Medical History:  Past Medical History  Diagnosis Date  . Emphysema   . Depression   . Bipolar 1 disorder   . Pulmonary embolism August 2013    off anticoagulation 12/2011 per pt  . IV drug user     last IV drug use in 2011   Past Surgical History:  Past Surgical History  Procedure Laterality Date  . Hand surgery     HPI:  42 year old male patient with known history of bipolar disorder. Recently on Xarelto for pulmonary embolism but did complete a six-month course of treatment. Patient also has chronic back pain and is on chronic opiates managed by a pain physician in [place], [place]. He originally presented to the [facility] on 09/20/2012 after being found unresponsive by his mother. He taken his usual pain medications as prescribed but had recently been started on amitriptyline. Patient did require intubation in the field. After awakening in the emergency department at the [facility] he decided to leave AGAINST MEDICAL ADVICE after staff they are told him they felt he was oversedated because he had intentionally overdosed in an attempt to commit suicide. Patient adamantly denied this. Because he still felt poorly his mother then drove him to regional [place] where he was evaluated at [facility]. He was found to be hypoxic with O2 sats in the 80s. He had urinary retention with a bladder scan demonstrating 600 cc of urine. Additional emergency department evaluation revealed troponin of 2.57 with a total CPK of 23,887 and a CK-MB of 173. EKG but was without ischemic changes. Because of concern over need for possible extensive cardiac evaluation the patient was subsequently transferred to [facility] in [place].  Pt found to have rhabdomyolyosis, AKI, NSTEMI, COPD and now throat pain  following intubation with coughing with liquids.    Assessment / Plan / Recommendation Clinical Impression  Pt. reports intermittent hoarse voice likely due to GERD prior to intubation, but voice is much more hoarse and throat is now sore since intubation.  Intermittent dysphonia, and probable decreased vocal cord adduction for phonation and swallowing.  Positive s/s of aspiration observed at b/s with water, and pt. reports applesauce "sticks" in throat.  Objective swallow evaluation is indicated.    Aspiration Risk       Diet Recommendation NPO;Ice chips PRN after oral care        Other  Recommendations Recommended Consults: MBS   Follow Up Recommendations   (TBD)    Frequency and Duration        Pertinent Vitals/Pain "9" back pain and sore throat.    SLP Swallow Goals     Swallow Study Prior Functional Status       General HPI: 42 year old male patient with known history of bipolar disorder. Recently on Xarelto for pulmonary embolism but did complete a six-month course of treatment. Patient also has chronic back pain and is on chronic opiates managed by a pain physician in [place], [place]. He originally presented to the [facility] on 09/20/2012 after being found unresponsive by his mother. He taken his usual pain medications as prescribed but had recently been started on amitriptyline. Patient did require intubation in the field. After awakening in the emergency department at the [facility] he decided to leave AGAINST MEDICAL ADVICE after staff they are told him they  felt he was oversedated because he had intentionally overdosed in an attempt to commit suicide. Patient adamantly denied this. Because he still felt poorly his mother then drove him to regional [place] where he was evaluated at [facility]. He was found to be hypoxic with O2 sats in the 80s. He had urinary retention with a bladder scan demonstrating 600 cc of urine. Additional  emergency department evaluation revealed troponin of 2.57 with a total CPK of 23,887 and a CK-MB of 173. EKG but was without ischemic changes. Because of concern over need for possible extensive cardiac evaluation the patient was subsequently transferred to [facility] in [place].  Pt found to have rhabdomyolyosis, AKI, NSTEMI, COPD and now throat pain following intubation with coughing with liquids.  Type of Study: Bedside swallow evaluation Previous Swallow Assessment: None Diet Prior to this Study: Regular;Thin liquids Temperature Spikes Noted: No Respiratory Status: Room air History of Recent Intubation: Yes Length of Intubations (days): 1 days Date extubated: 09/21/12 Behavior/Cognition: Alert;Cooperative Oral Cavity - Dentition: Adequate natural dentition;Missing dentition;Poor condition Self-Feeding Abilities: Able to feed self Patient Positioning: Upright in bed Baseline Vocal Quality: Breathy;Hoarse;Low vocal intensity Volitional Cough: Weak Volitional Swallow: Unable to elicit    Oral/Motor/Sensory Function Overall Oral Motor/Sensory Function: Appears within functional limits for tasks assessed   Ice Chips Ice chips: Impaired Presentation: Spoon Pharyngeal Phase Impairments: Throat Clearing - Delayed (Pain with swallow)   Thin Liquid Thin Liquid: Impaired Presentation: Cup Pharyngeal  Phase Impairments: Cough - Immediate    Nectar Thick Nectar Thick Liquid: Not tested   Honey Thick Honey Thick Liquid: Not tested   Puree Puree: Impaired Presentation: Self Fed;Spoon Pharyngeal Phase Impairments: Multiple swallows;Throat Clearing - Immediate (C/o food sticking in throat)   Solid   GO    Solid: Not tested       [name] T 09/23/2012,10:48 AM

## 2012-09-24 ENCOUNTER — Inpatient Hospital Stay (HOSPITAL_COMMUNITY): Payer: Self-pay

## 2012-09-24 DIAGNOSIS — R079 Chest pain, unspecified: Secondary | ICD-10-CM

## 2012-09-24 LAB — GLUCOSE, CAPILLARY
Glucose-Capillary: 90 mg/dL (ref 70–99)
Glucose-Capillary: 119 mg/dL — ABNORMAL HIGH (ref 70–99)

## 2012-09-24 MED ORDER — METOPROLOL TARTRATE 12.5 MG HALF TABLET
12.5000 mg | ORAL_TABLET | Freq: Two times a day (BID) | ORAL | Status: DC
  Administered 2012-09-24 – 2012-09-26 (×4): 12.5 mg via ORAL
  Filled 2012-09-24 (×6): qty 1

## 2012-09-24 MED ORDER — TECHNETIUM TC 99M SESTAMIBI GENERIC - CARDIOLITE
30.0000 | Freq: Once | INTRAVENOUS | Status: AC | PRN
  Administered 2012-09-24: 30 via INTRAVENOUS

## 2012-09-24 MED ORDER — SODIUM CHLORIDE 0.9 % IV BOLUS (SEPSIS)
250.0000 mL | Freq: Once | INTRAVENOUS | Status: AC
  Administered 2012-09-24: 250 mL via INTRAVENOUS

## 2012-09-24 MED ORDER — TECHNETIUM TC 99M SESTAMIBI GENERIC - CARDIOLITE
10.0000 | Freq: Once | INTRAVENOUS | Status: AC | PRN
  Administered 2012-09-24: 10 via INTRAVENOUS

## 2012-09-24 MED ORDER — MAGNESIUM HYDROXIDE 400 MG/5ML PO SUSP
30.0000 mL | Freq: Once | ORAL | Status: AC
  Administered 2012-09-24: 30 mL via ORAL
  Filled 2012-09-24: qty 30

## 2012-09-24 MED ORDER — ZOLPIDEM TARTRATE 5 MG PO TABS
5.0000 mg | ORAL_TABLET | Freq: Once | ORAL | Status: AC
  Administered 2012-09-25: 5 mg via ORAL
  Filled 2012-09-24: qty 1

## 2012-09-24 MED ORDER — REGADENOSON 0.4 MG/5ML IV SOLN
0.4000 mg | Freq: Once | INTRAVENOUS | Status: AC
  Administered 2012-09-24: 0.4 mg via INTRAVENOUS

## 2012-09-24 MED ORDER — ENSURE PUDDING PO PUDG
1.0000 | Freq: Three times a day (TID) | ORAL | Status: DC
  Administered 2012-09-24 – 2012-09-26 (×4): 1 via ORAL

## 2012-09-24 NOTE — Progress Notes (Signed)
Filed Vitals:   09/23/12 0500 09/23/12 1551 09/23/12 2157 09/24/12 0532  BP: 146/75 112/74 108/72 112/65  Pulse: 101 103 125 90  Temp: 98.6 F (37 C) 98.2 F (36.8 C) 98.5 F (36.9 C) 98.3 F (36.8 C)  TempSrc: Oral Oral Oral Oral  Resp: 20 18 20 18   Height:      Weight:      SpO2: 100% 98% 95% 94%    Intake/Output Summary (Last 24 hours) at 09/24/12 0858 Last data filed at 09/24/12 0210  Gross per 24 hour  Intake    240 ml  Output   1075 ml  Net   -835 ml    SUBJECTIVE No chest pain, still has sore throat. Continues to complain of severe back pain. Got very weak after shower yesterday.  LABS: Basic Metabolic Panel:  Recent Labs  16/10/96 0425 09/23/12 0520  NA 138 138  K 3.4* 4.1  CL 96 98  CO2 32 30  GLUCOSE 105* 105*  BUN 13 11  CREATININE 1.67* 1.26  CALCIUM 8.2* 9.2   Liver Function Tests:  Recent Labs  09/22/12 0425 09/23/12 0520  AST 520* 305*  ALT 203* 172*  ALKPHOS 82 85  BILITOT 0.4 0.4  PROT 5.8* 6.2  ALBUMIN 2.7* 2.7*   CBC:  Recent Labs  09/21/12 2124  09/22/12 0820 09/23/12 0520  WBC 12.5*  < > 9.7 8.5  NEUTROABS 9.7*  --   --   --   HGB 10.7*  < > 10.8* 12.5*  HCT 31.4*  < > 31.3* 36.0*  MCV 89.2  < > 89.2 88.2  PLT 235  < > 252 286  < > = values in this interval not displayed. Cardiac Enzymes:  Recent Labs  09/21/12 2124 09/22/12 0130 09/22/12 0425 09/22/12 0820 09/23/12 0520  CKTOTAL >20000*  --  04540* 98119* 10815*  CKMB  --   --   --  27.8*  --   TROPONINI 2.57* 1.87*  --  1.17*  --     Radiology/Studies:  Dg Chest Port 1 View 09/21/2012   *RADIOLOGY REPORT*  Clinical Data: Hypoxia, COPD  PORTABLE CHEST - 1 VIEW  Comparison: 02/12/2012  Findings: Diffuse interstitial edema pattern throughout the lungs compatible with CHF.  Bibasilar atelectasis also evident.  No large effusion or pneumothorax.  Trachea midline.  IMPRESSION: Mild diffuse interstitial edema and basilar atelectasis.   Original Report  Authenticated By: Judie Petit. Miles Costain, M.D.     PHYSICAL EXAM General: Well developed, well nourished, male in no acute distress. Head: Normocephalic, atraumatic, sclera non-icteric, no xanthomas, nares are without discharge. Hoarse. Neck: Negative for carotid bruits. JVD not elevated. Lungs: Clear bilaterally to auscultation without wheezes, rales, few rhonchi. Breathing is unlabored. Heart: RRR S1 S2 without murmurs, rubs, or gallops.  Abdomen: Soft, non-tender, non-distended with normoactive bowel sounds. No hepatomegaly. No rebound/guarding. No obvious abdominal masses. Msk:  Strength and tone appears normal for age. Extremities: No clubbing, cyanosis or edema.  Distal pedal pulses are 2+ and equal bilaterally. Neuro: Alert and oriented X 3. Moves all extremities spontaneously. Psych:  Responds to questions appropriately with a normal affect.  Scheduled Meds: . ALPRAZolam  0.5 mg Oral TID  . aspirin EC  81 mg Oral Daily  . heparin subcutaneous  5,000 Units Subcutaneous Q8H  . insulin aspart  0-9 Units Subcutaneous TID WC  . nicotine  14 mg Transdermal Daily    ASSESSMENT AND PLAN:  Active Problems:   Rhabdomyolysis  AKI (acute kidney injury)    NSTEMI (non-ST elevated myocardial infarction) - elevated cardiac enzymes but in the setting of CK > 20,000/ rhabdo. MB index is very low. Echo shows global hypokinesis with EF 45%. No ischemic symptoms.     Urinary retention   Drug overdose, multiple drugs   Transaminitis   Hypokalemia   Diabetes mellitus   Chronic back pain   Dysphagia.   Patient has cardiac risk factors of DM, tobacco abuse and family history of CAD. Presentation is primarily one of rhabdo. Markedly elevated CK with mildly elevated troponin. LFTs also elevated. Ecg without acute ST-T changes. Echo shows global hypokinesis with overall EF approx. 45%. This is consistent with demand situation from rhabdo, respiratory failure, urinary obstruction, and ARF. Elevated CK and  LFTs improving. Renal function better.He is a poor candidate for invasive evaluation at this point. Will proceed with   Steffanie Dunn study today to further stratify risk.  Avoid ACEi/ARB now due to ARF. Appears fairly euvolemic now.   Theron Arista [facility]  09/24/2012 8:58 AM

## 2012-09-24 NOTE — Progress Notes (Addendum)
Patient states that he was going to leave AMA.  Patient's mother at bedside she talked to son and patient stated he was going to wait and talk with MD in the am before leaving. Will continue to monitor patient. Nelda Marseille, RN

## 2012-09-24 NOTE — Progress Notes (Signed)
Lexiscan cardiolite performed. BP did drop with Lexiscan into 80's with associated tingling of extremities thus 250cc NS bolus given with improvement in symptoms, BP. No chest pain but transient SOB that resolved. EKG did show inferior ST-T change with stress that were improving by end of test. He also had abnormal ST-T complex in V3 that was present at beginning of test. EKG tracings reviewed with Dr. Swaziland. Will await imaging results. Tieara Flitton PA-C

## 2012-09-24 NOTE — Progress Notes (Signed)
Notified Craige Cotta, NP that patient requesting Ambien for sleep since he is staying tonight and not leaving AMA. Craige Cotta, NP put in Ambien 5mg  pox1. Will continue to monitor patient. Nelda Marseille, RN

## 2012-09-24 NOTE — Progress Notes (Signed)
Speech Language Pathology Dysphagia Treatment Patient Details Name: Bradley Day MRN: [id number] DOB: 1970/08/10 Today's Date: 09/24/2012 Time: 4098-1191 SLP Time Calculation (min): 23 min  Assessment / Plan / Recommendation Clinical Impression  Pt. continues to have hoarse/raspy voice and a pharyngeal dysphagia.  Attempted to swallow small sip of water, with immediate cough response.  Appears to  tolerate nectar thick liquids and soft solids, but c/o severe throat pain with swallowing (and at rest).  Pt. states his throat pain is a "7" after pain meds and Chloroseptic spray.  Prior to meds pt. states his pain was a "10".  Pt. and mother with questions re: the etiology of his dysphagia, aspiration of thin liquids, throat pain, and hoarseness.   Possibly due to s/p intubation, but would expect symptoms to begin subsiding.  If not improved in the next day or so, recommend an ENT consult.    Diet Recommendation  Continue with Current Diet: Dysphagia 3 (mechanical soft);Nectar-thick liquid    SLP Plan Continue with current plan of care   Pertinent Vitals/Pain "7" throat pain after pain meds and chloroseptic spray Also c/o back pain.  RN notified.   Swallowing Goals     General Temperature Spikes Noted: No Respiratory Status: Room air Behavior/Cognition: Alert;Cooperative Oral Cavity - Dentition: Adequate natural dentition;Missing dentition;Poor condition Patient Positioning: Upright in bed  Oral Cavity - Oral Hygiene Does patient have any of the following "at risk" factors?: None of the above Brush patient's teeth BID with toothbrush (using toothpaste with fluoride): Yes Patient is HIGH RISK - Oral Care Protocol followed (see row info): Yes Patient is AT RISK - Oral Care Protocol followed (see row info): Yes Patient is mechanically ventilated, follow VAP prevention protocol for oral care: Oral care provided every 4 hours   Dysphagia Treatment Treatment focused on: Skilled observation  of diet tolerance;Upgraded PO texture trials;Patient/family/caregiver Dealer Educated: Mother Treatment Methods/Modalities: Skilled observation;Differential diagnosis Patient observed directly with PO's: Yes Type of PO's observed: Dysphagia 3 (soft);Nectar-thick liquids;Thin liquids Feeding: Able to feed self Liquids provided via: Cup Pharyngeal Phase Signs & Symptoms: Immediate cough Type of cueing: Verbal Amount of cueing: Minimal   GO     Bradley Day T 09/24/2012, 4:24 PM

## 2012-09-24 NOTE — Progress Notes (Signed)
Placed kpad to pt's back. Will continue to monitor. Nelda Marseille, RN

## 2012-09-24 NOTE — Progress Notes (Signed)
Discussed nuc results with Dr. Swaziland.  IMPRESSION: 1. EF 37% with global hypokinesis. 2. The rest study was count-poor. There was a primarily fixed, small, mild basal to mid inferior perfusion defect. This may have been due to attenuation artifact related to the subdiaphragmatic radiotracer uptake. There is no ischemia. 3. Suspect nonischemic cardiomyopathy. Overall intermediate risk study due to decreased EF but no ischemia noted.  EF down as above. Dr. Swaziland reviewed his echo and felt that his EF on that study was lower than reported, more consistent with nuc. Plan to treat as NICM. Will add metoprolol 12.5mg  BID (limited by BP). No ACEI/ARB at present given renal issues and hypotension.  Jaquisha Frech PA-C

## 2012-09-24 NOTE — Clinical Social Work Note (Signed)
CSW unable to meet with patient about substance abuse resources in [place], [place]. CSW left a list of SA/MH resources in [place], [place] with nurse to give to patient upon DC. CSW signing off. Roddie Mc, [place], [place], [phone]

## 2012-09-24 NOTE — Progress Notes (Signed)
PATIENT DETAILS Name: Bradley Day Age: 42 y.o. Sex: male Date of Birth: Jan 28, 1971 Admit Date: 09/21/2012 Admitting Physician Meredeth Ide, MD PCP:No primary provider on file.  Subjective: This AM he continues to complain of throat soreness and hoarseness as well as lower back pain which is chronic.  He also states he has not had a BM since 8/1.   Reported he cannot walk, when I mentioned he'll probably be discharged in AM, PT to evaluate.  Assessment/Plan:  Rhabdomyolysis  -multifactorial: primarily medication related from pre admit Metformin in setting of oversedation from usual pain meds and prolonged immobility without movement x 2.5 hours (completely immobile per pt mom)  -CPK slow trending down (24576 on 8/4, 10815 on 8/5) -resume IVF at rate of 125/hr  -would NOT resume Metformin upon dc   AKI (acute kidney injury)  -due to rhabdo  -urinary retention was transient and 2/2 narcs  -renal function slowly improving  -follow lytes   ?NSTEMI - doubt  -Cards following  -they suspect mild demand ischemia -TNI is trending down and appears solely due to ARF/rhabdomyolosis  -ECHO shows grade II diastolic dysfunction with an ejection fraction of 45-50% -Lexiscan myoview showed no inducible ischemia. -Cardiology recommended beta blockers, may be an ACEI in 2-3 weeks after he recovers from his ARF.  Acute respiratory failure with hypoxia and mild hypercarbia in known COPD  -has been smoking since age 77  -CXR and ABG c/w COPD  -cont supportive care   Diabetes mellitus  -was on Metformin pre admit- would stop PERMANENTLY given presentation with rhabdomyolosis  -HgbA1C is 5.7 (09/22/12)   Laryngitis  -onset after intubation  -RN says pt coughs/chokes with attempts to swallow -SLP evaluated-objective swallow evaluation is pending.  Patient on Dys 2 nectar thick. -prn Chloraseptic spray-see how he does-may need ENT eval if persistent   Chronic back pain  -cautious use of narcs  and neuropathic agents  Transaminitis  -likely due to low perfusion and possible hypotension pre admit and rhabdo  -trending down  Hypokalemia  -3.4 on 8/4, 4.1 on 8/5 -Repeat prn  Disposition: Remain inpatient  DVT Prophylaxis: Prophylactic Heparin  Code Status: Full code  Family Communication Mother at bedside  Procedures:  None  CONSULTS:  Cardiology   MEDICATIONS: Scheduled Meds: . ALPRAZolam  0.5 mg Oral TID  . aspirin EC  81 mg Oral Daily  . heparin subcutaneous  5,000 Units Subcutaneous Q8H  . insulin aspart  0-9 Units Subcutaneous TID WC  . nicotine  14 mg Transdermal Daily   Continuous Infusions:  PRN Meds:.sodium chloride, acetaminophen, oxyCODONE, phenol, RESOURCE THICKENUP CLEAR  Antibiotics: Anti-infectives   None       PHYSICAL EXAM: Vital signs in last 24 hours: Filed Vitals:   09/23/12 0500 09/23/12 1551 09/23/12 2157 09/24/12 0532  BP: 146/75 112/74 108/72 112/65  Pulse: 101 103 125 90  Temp: 98.6 F (37 C) 98.2 F (36.8 C) 98.5 F (36.9 C) 98.3 F (36.8 C)  TempSrc: Oral Oral Oral Oral  Resp: 20 18 20 18   Height:      Weight:      SpO2: 100% 98% 95% 94%    Weight change:  Filed Weights   09/21/12 2031  Weight: 81.647 kg (180 lb)   Body mass index is 23.75 kg/(m^2).   Gen Exam: Awake and alert with clear speech.  NAD. Neck: Supple, No JVD.  Chest: B/L Clear. CVS: S1 S2 Regular, no murmurs. Abdomen: soft, BS +, non tender, non  distended. Extremities: no edema, lower extremities warm to touch. Neurologic: Non Focal. Skin: No Rash.   Intake/Output from previous day:  Intake/Output Summary (Last 24 hours) at 09/24/12 1003 Last data filed at 09/24/12 0210  Gross per 24 hour  Intake    240 ml  Output   1075 ml  Net   -835 ml     LAB RESULTS: CBC  Recent Labs Lab 09/21/12 2124 09/22/12 0425 09/22/12 0820 09/23/12 0520  WBC 12.5* 12.4* 9.7 8.5  HGB 10.7* 11.1* 10.8* 12.5*  HCT 31.4* 32.3* 31.3* 36.0*   PLT 235 257 252 286  MCV 89.2 88.7 89.2 88.2  MCH 30.4 30.5 30.8 30.6  MCHC 34.1 34.4 34.5 34.7  RDW 13.0 13.0 13.1 12.5  LYMPHSABS 1.5  --   --   --   MONOABS 1.2*  --   --   --   EOSABS 0.1  --   --   --   BASOSABS 0.0  --   --   --     Chemistries   Recent Labs Lab 09/21/12 2124 09/22/12 0425 09/23/12 0520  NA 134* 138 138  K 3.2* 3.4* 4.1  CL 94* 96 98  CO2 32 32 30  GLUCOSE 117* 105* 105*  BUN 12 13 11   CREATININE 1.80* 1.67* 1.26  CALCIUM 8.1* 8.2* 9.2    CBG:  Recent Labs Lab 09/23/12 0737 09/23/12 1212 09/23/12 1627 09/23/12 2155 09/24/12 0751  GLUCAP 108* 104* 102* 108* 105*    GFR Estimated Creatinine Clearance: 85.4 ml/min (by C-G formula based on Cr of 1.26).   Cardiac Enzymes  Recent Labs Lab 09/21/12 2124 09/22/12 0130 09/22/12 0820  CKMB  --   --  27.8*  TROPONINI 2.57* 1.87* 1.17*     Recent Labs  09/22/12 1458  HGBA1C 5.7*    MICROBIOLOGY: Recent Results (from the past 240 hour(s))  CULTURE, BLOOD (ROUTINE X 2)     Status: None   Collection Time    09/21/12  9:24 PM      Result Value Range Status   Specimen Description LEFT ANTECUBITAL   Final   Special Requests BOTTLES DRAWN AEROBIC AND ANAEROBIC 8CC EACH   Final   Culture NO GROWTH 2 DAYS   Final   Report Status PENDING   Incomplete  CULTURE, BLOOD (ROUTINE X 2)     Status: None   Collection Time    09/21/12  9:30 PM      Result Value Range Status   Specimen Description BLOOD RIGHT HAND   Final   Special Requests BOTTLES DRAWN AEROBIC AND ANAEROBIC 6CC EACH   Final   Culture NO GROWTH 2 DAYS   Final   Report Status PENDING   Incomplete  URINE CULTURE     Status: None   Collection Time    09/21/12 11:04 PM      Result Value Range Status   Specimen Description URINE, CATHETERIZED   Final   Special Requests NONE   Final   Culture  Setup Time     Final   Value: 09/22/2012 04:40     Performed at [facility] Count     Final   Value: NO GROWTH      Performed at [facility]   Culture     Final   Value: NO GROWTH     Performed at [facility]   Report Status 09/23/2012 FINAL   Final  MRSA PCR SCREENING  Status: None   Collection Time    09/22/12  1:41 AM      Result Value Range Status   MRSA by PCR NEGATIVE  NEGATIVE Final   Comment:            The GeneXpert MRSA Assay (FDA     approved for NASAL specimens     only), is one component of a     comprehensive MRSA colonization     surveillance program. It is not     intended to diagnose MRSA     infection nor to guide or     monitor treatment for     MRSA infections.    RADIOLOGY STUDIES/RESULTS: Dg Chest Port 1 View  09/21/2012   *RADIOLOGY REPORT*  Clinical Data: Hypoxia, COPD  PORTABLE CHEST - 1 VIEW  Comparison: 02/12/2012  Findings: Diffuse interstitial edema pattern throughout the lungs compatible with CHF.  Bibasilar atelectasis also evident.  No large effusion or pneumothorax.  Trachea midline.  IMPRESSION: Mild diffuse interstitial edema and basilar atelectasis.   Original Report Authenticated By: Judie Petit. Miles Costain, M.D.    Rema Fendt, PA-S [facility]   If 7PM-7AM, please contact night-coverage [email or web address] Password [email or web address] 09/24/2012, 10:03 AM   LOS: 3 days   Addendum  Patient seen and examined, chart and data base reviewed.  I agree with the above assessment and plan.  For full details please see Ms. Rema Fendt, PA-S note.   Clint Lipps, MD [facility] Pager: [phone] 09/24/2012, 5:02 PM

## 2012-09-25 DIAGNOSIS — Z5189 Encounter for other specified aftercare: Secondary | ICD-10-CM

## 2012-09-25 DIAGNOSIS — I428 Other cardiomyopathies: Secondary | ICD-10-CM | POA: Diagnosis present

## 2012-09-25 LAB — COMPREHENSIVE METABOLIC PANEL
ALT: 115 U/L — ABNORMAL HIGH (ref 0–53)
BUN: 16 mg/dL (ref 6–23)
CO2: 24 mEq/L (ref 19–32)
Calcium: 9.7 mg/dL (ref 8.4–10.5)
Creatinine, Ser: 1.05 mg/dL (ref 0.50–1.35)
GFR calc Af Amer: >90 mL/min (ref >90)
GFR calc non Af Amer: 85 mL/min — ABNORMAL LOW (ref >90)
Glucose, Bld: 111 mg/dL — ABNORMAL HIGH (ref 70–99)
Sodium: 136 mEq/L (ref 135–145)
Total Protein: 7.5 g/dL (ref 6.0–8.3)

## 2012-09-25 LAB — CBC
HCT: 39.6 % (ref 39.0–52.0)
Hemoglobin: 14.4 g/dL (ref 13.0–17.0)
MCH: 31.6 pg (ref 26.0–34.0)
MCHC: 36.4 g/dL — ABNORMAL HIGH (ref 30.0–36.0)
MCV: 86.8 fL (ref 78.0–100.0)
RBC: 4.56 MIL/uL (ref 4.22–5.81)

## 2012-09-25 LAB — GLUCOSE, CAPILLARY
Glucose-Capillary: 113 mg/dL — ABNORMAL HIGH (ref 70–99)
Glucose-Capillary: 116 mg/dL — ABNORMAL HIGH (ref 70–99)
Glucose-Capillary: 249 mg/dL — ABNORMAL HIGH (ref 70–99)

## 2012-09-25 MED ORDER — FENTANYL CITRATE 0.05 MG/ML IJ SOLN: 25.0000 ug | Freq: Once | INTRAMUSCULAR | Status: AC

## 2012-09-25 MED ORDER — FENTANYL CITRATE 0.05 MG/ML IJ SOLN
25.0000 ug | Freq: Four times a day (QID) | INTRAMUSCULAR | Status: DC
  Administered 2012-09-25 – 2012-09-26 (×4): 25 ug via INTRAVENOUS
  Filled 2012-09-25 (×4): qty 2

## 2012-09-25 MED ORDER — LIDOCAINE VISCOUS 2 % MT SOLN
15.0000 mL | OROMUCOSAL | Status: DC | PRN
  Administered 2012-09-26: 15 mL via OROMUCOSAL
  Filled 2012-09-25 (×3): qty 15

## 2012-09-25 MED ORDER — METHYLPREDNISOLONE SODIUM SUCC 125 MG IJ SOLR
80.0000 mg | Freq: Three times a day (TID) | INTRAMUSCULAR | Status: DC
  Administered 2012-09-25 – 2012-09-26 (×3): 80 mg via INTRAVENOUS
  Filled 2012-09-25 (×4): qty 1.28
  Filled 2012-09-25: qty 2
  Filled 2012-09-25 (×2): qty 1.28

## 2012-09-25 MED ORDER — FENTANYL CITRATE 0.05 MG/ML IJ SOLN
INTRAMUSCULAR | Status: AC
  Administered 2012-09-25: 25 ug via INTRAVENOUS
  Filled 2012-09-25: qty 2

## 2012-09-25 MED ORDER — LIDOCAINE VISCOUS 2 % MT SOLN
15.0000 mL | OROMUCOSAL | Status: AC
  Administered 2012-09-25: 15 mL via OROMUCOSAL
  Filled 2012-09-25: qty 15

## 2012-09-25 NOTE — Evaluation (Signed)
Occupational Therapy Evaluation Patient Details Name: Bradley Day MRN: [id number] DOB: 10/03/1970 Today's Date: 09/25/2012 Time: 2130-8657 OT Time Calculation (min): 8 min  OT Assessment / Plan / Recommendation History of present illness 42 y.o. male admitted to [facility] from [facility] on 09/21/12 with bipolar disorder, pulmonary embolism off anticoagulation after completion of 6 months on the Xarelto  as per patient, chronic back pain, chronic opiate use who was initially admitted to the [facility] on 09/20/2012 after patient was found to be unresponsive by his mother. Patient has been taking oxycodone, Neurontin and was recently started on amitriptyline 150 mg by mouth daily. Patient was intubated and sedated to protect the airway, today patient left AMA from [facility] after they told patient that he tried to commit suicide, as per patient. Patient came to the [facility], and was found to be hypoxic with O2 sats in 80s, urinary retention With bladder scan showing 600 cc of urine. The lab work done in the ED showed significant elevation of troponin of 2.57, his previous troponin from [facility] was 2.210. Him patient's [place] CPK [place] [address], CK-MB 173.8. EKG done in the ED shows no acute changes.   Clinical Impression   Patient evaluated by Occupational Therapy with no further acute OT needs identified. All education has been completed and the patient has no further questions. Pt currently requires supervision for BADLs due to mild balance deficits, anticipate he will quickly progress back to independent level.  Encouraged him to walk with nursing and family See below for any follow-up Occupational Therapy or equipment needs. OT is signing off. Thank you for this referral.     OT Assessment  Patient does not need any further OT services    Follow Up Recommendations  No OT follow up    Barriers to Discharge      Equipment Recommendations  None  recommended by OT    Recommendations for Other Services    Frequency       Precautions / Restrictions Precautions Precautions: Fall;Other (comment)   Pertinent Vitals/Pain     ADL  Eating/Feeding: Independent Where Assessed - Eating/Feeding: Bed level;Edge of bed;Chair Grooming: Wash/dry hands;Wash/dry face;Teeth care;Brushing hair;Supervision/safety Where Assessed - Grooming: Unsupported standing Upper Body Bathing: Set up Where Assessed - Upper Body Bathing: Unsupported sitting Lower Body Bathing: Supervision/safety Where Assessed - Lower Body Bathing: Unsupported sit to stand Upper Body Dressing: Supervision/safety;Set up Where Assessed - Upper Body Dressing: Unsupported sitting Lower Body Dressing: Supervision/safety Where Assessed - Lower Body Dressing: Unsupported sit to stand Toilet Transfer: Supervision/safety Toilet Transfer Method: Sit to stand;Stand pivot Toilet Transfer Equipment: Regular height toilet;Comfort height toilet Toileting - Clothing Manipulation and Hygiene: Supervision/safety Where Assessed - Engineer, mining and Hygiene: Standing Tub/Shower Transfer: Landscape architect Method: Ambulating Transfers/Ambulation Related to ADLs: S ADL Comments: Pt with mild balance deficit.  Pt instructed with movements to avoid with back pain - no bending, arching, and twisting,a nd instructed him in alternative methods for LB ADLs    OT Diagnosis:    OT Problem List:   OT Treatment Interventions:     OT Goals(Current goals can be found in the care plan section) Acute Rehab OT Goals Patient Stated Goal: to get swallowing back to normal  Visit Information  Last OT Received On: 09/25/12 Assistance Needed: +1 History of Present Illness: 42 y.o. male admitted to [facility] from [facility] on 09/21/12 with bipolar disorder, pulmonary embolism off anticoagulation after completion of 6 months on  the Xarelto  as per patient, chronic back pain,  chronic opiate use who was initially admitted to the [facility] on 09/20/2012 after patient was found to be unresponsive by his mother. Patient has been taking oxycodone, Neurontin and was recently started on amitriptyline 150 mg by mouth daily. Patient was intubated and sedated to protect the airway, today patient left AMA from [facility] after they told patient that he tried to commit suicide, as per patient. Patient came to the [facility], and was found to be hypoxic with O2 sats in 80s, urinary retention With bladder scan showing 600 cc of urine. The lab work done in the ED showed significant elevation of troponin of 2.57, his previous troponin from [facility] was 2.210. Him patient's total CPK is [address], CK-MB 173.8. EKG done in the ED shows no acute changes.       Prior Functioning     Home Living Family/patient expects to be discharged to:: Private residence Living Arrangements: Alone (pt split from his 2 y.o. wife) Available Help at Discharge: Family;Available PRN/intermittently (mother lives across town) Type of Home: Apartment Home Access: Level entry Home Layout: One level Home Equipment: None Prior Function Level of Independence: Independent Comments: was not employed Communication Communication: Other (comment)         Vision/Perception     Cognition  Cognition Arousal/Alertness: Awake/alert Behavior During Therapy: WFL for tasks assessed/performed Overall Cognitive Status: Within Functional Limits for tasks assessed    Extremity/Trunk Assessment Upper Extremity Assessment Upper Extremity Assessment: Generalized weakness Lower Extremity Assessment Lower Extremity Assessment: Generalized weakness Cervical / Trunk Assessment Cervical / Trunk Assessment: Normal     Mobility Bed Mobility Bed Mobility: Supine to Sit;Sitting - Scoot to Edge of Bed;Sit to Supine Supine to Sit: 6: Modified independent  (Device/Increase time);With rails;HOB elevated Sitting - Scoot to Edge of Bed: 6: Modified independent (Device/Increase time);With rail Sit to Supine: 6: Modified independent (Device/Increase time);With rail;HOB elevated Details for Bed Mobility Assistance: used railing to get to EOB, but did so quickly and easily Transfers Transfers: Sit to Stand;Stand to Sit Sit to Stand: 5: Supervision;With upper extremity assist;From bed Stand to Sit: 5: Supervision;With upper extremity assist;To bed Details for Transfer Assistance: supervision for safety     Exercise     Balance     End of Session OT - End of Session Activity Tolerance: Patient tolerated treatment well Patient left: in bed;with call bell/phone within reach;with family/visitor present  GO     Alessia Gonsalez, Ursula Alert M 09/25/2012, 2:51 PM

## 2012-09-25 NOTE — Progress Notes (Signed)
Patient refused kpad, took kpad out of room. Will continue to monitor patient. Nelda Marseille, RN

## 2012-09-25 NOTE — Progress Notes (Signed)
Filed Vitals:   09/24/12 1207 09/24/12 1452 09/24/12 2123 09/25/12 0514  BP: 99/63 132/57 107/59 98/62  Pulse:  88 107 111  Temp:  98 F (36.7 C) 98.9 F (37.2 C) 98.3 F (36.8 C)  TempSrc:  Oral Oral Oral  Resp:  20 18 20   Height:      Weight:      SpO2:  96% 94% 93%    Intake/Output Summary (Last 24 hours) at 09/25/12 0730 Last data filed at 09/24/12 1300  Gross per 24 hour  Intake      0 ml  Output      0 ml  Net      0 ml    SUBJECTIVE No chest pain or SOB. Frustrated with sore throat and trouble swallowing.  LABS: Basic Metabolic Panel:  Recent Labs  95/62/13 0520  NA 138  K 4.1  CL 98  CO2 30  GLUCOSE 105*  BUN 11  CREATININE 1.26  CALCIUM 9.2   Liver Function Tests:  Recent Labs  09/23/12 0520  AST 305*  ALT 172*  ALKPHOS 85  BILITOT 0.4  PROT 6.2  ALBUMIN 2.7*   CBC:  Recent Labs  09/22/12 0820 09/23/12 0520  WBC 9.7 8.5  HGB 10.8* 12.5*  HCT 31.3* 36.0*  MCV 89.2 88.2  PLT 252 286   Cardiac Enzymes:  Recent Labs  09/22/12 0820 09/23/12 0520  CKTOTAL 08657* 10815*  CKMB 27.8*  --   TROPONINI 1.17*  --     Radiology/Studies:  Dg Chest Port 1 View 09/21/2012   *RADIOLOGY REPORT*  Clinical Data: Hypoxia, COPD  PORTABLE CHEST - 1 VIEW  Comparison: 02/12/2012  Findings: Diffuse interstitial edema pattern throughout the lungs compatible with CHF.  Bibasilar atelectasis also evident.  No large effusion or pneumothorax.  Trachea midline.  IMPRESSION: Mild diffuse interstitial edema and basilar atelectasis.   Original Report Authenticated By: Judie Petit. Shick, M.D.   MYOCARDIAL IMAGING WITH SPECT (REST AND PHARMACOLOGIC-STRESS)  GATED LEFT VENTRICULAR WALL MOTION STUDY  LEFT VENTRICULAR EJECTION FRACTION  Technique: Standard myocardial SPECT imaging was performed after  resting intravenous injection of 10 mCi Tc-24m Sestamibi.  Subsequently, intravenous infusion of regadenoson was performed  under the supervision of the Cardiology  staff. At peak effect of  the drug, 30 mCi Tc-73m Sestamibi was injected intravenously and  standard myocardial SPECT imaging was performed. Quantitative  gated imaging was also performed to evaluate left ventricular wall  motion, and estimate left ventricular ejection fraction.  Comparison: None.  Findings: The Lexiscan stress ECG will be reported elsewhere in the  chart. Gated images showed EF 37% with global hypokinesis. The  rest perfusion study was count-poor. The stress images showed a  mild, small basal to mid inferior perfusion defect. There was  significant subdiaphragmatic radiotracer uptake.  IMPRESSION:  1. EF 37% with global hypokinesis.  2. The rest study was count-poor. There was a primarily fixed,  small, mild basal to mid inferior perfusion defect. This may have  been due to attenuation artifact related to the subdiaphragmatic  radiotracer uptake. There is no ischemia.  3. Suspect nonischemic cardiomyopathy. Overall intermediate risk  study due to decreased EF but no ischemia noted.  Original Report Authenticated By: Marca Ancona   PHYSICAL EXAM General: Well developed, well nourished, male in no acute distress. Head: Normocephalic, atraumatic, sclera non-icteric, no xanthomas, nares are without discharge. Hoarse. Neck: Negative for carotid bruits. JVD not elevated. Lungs: Clear bilaterally to auscultation without wheezes, rales,  few rhonchi. Breathing is unlabored. Heart: RRR S1 S2 without murmurs, rubs, or gallops.  Abdomen: Soft, non-tender, non-distended with normoactive bowel sounds. No hepatomegaly. No rebound/guarding. No obvious abdominal masses. Msk:  Strength and tone appears normal for age. Extremities: No clubbing, cyanosis or edema.  Distal pedal pulses are 2+ and equal bilaterally. Neuro: Alert and oriented X 3. Moves all extremities spontaneously. Psych:  Responds to questions appropriately with a normal affect.  Scheduled Meds: . ALPRAZolam  0.5 mg  Oral TID  . aspirin EC  81 mg Oral Daily  . feeding supplement  1 Container Oral TID BM  . heparin subcutaneous  5,000 Units Subcutaneous Q8H  . insulin aspart  0-9 Units Subcutaneous TID WC  . metoprolol tartrate  12.5 mg Oral BID  . nicotine  14 mg Transdermal Daily    ASSESSMENT AND PLAN:  Active Problems:   Rhabdomyolysis   AKI (acute kidney injury)    NSTEMI (non-ST elevated myocardial infarction) - elevated cardiac enzymes but in the setting of CK > 20,000/ rhabdo. MB index is very low. Echo shows global hypokinesis with EF 45%. No ischemic symptoms. Myoview shows no ischemia with EF 37%.   Nonischemic cardiomyopathy    Urinary retention   Drug overdose, multiple drugs   Transaminitis   Hypokalemia   Diabetes mellitus   Chronic back pain   Dysphagia.  Plan: will treat with low dose beta blocker for now. BP still pretty soft so will need to adjust slowly. Given rhabdo and ARF would wait at least 2 weeks before starting ACEi or ARB. Patient appears euvolemic.  Theron Arista [facility]  09/25/2012 7:30 AM

## 2012-09-25 NOTE — Evaluation (Signed)
Physical Therapy One Time Evaluation/Discharge Patient Details Name: Bradley Day MRN: [id number] DOB: 12-01-1970 Today's Date: 09/25/2012 Time: 8295-6213 PT Time Calculation (min): 23 min  PT Assessment / Plan / Recommendation History of Present Illness  42 y.o. male admitted to [facility] from [facility] on 09/21/12 with bipolar disorder, pulmonary embolism off anticoagulation after completion of 6 months on the Xarelto  as per patient, chronic back pain, chronic opiate use who was initially admitted to the [facility] on 09/20/2012 after patient was found to be unresponsive by his mother. Patient has been taking oxycodone, Neurontin and was recently started on amitriptyline 150 mg by mouth daily. Patient was intubated and sedated to protect the airway, today patient left AMA from [facility] after they told patient that he tried to commit suicide, as per patient. Patient came to the [facility], and was found to be hypoxic with O2 sats in 80s, urinary retention With bladder scan showing 600 cc of urine. The lab work done in the ED showed significant elevation of troponin of 2.57, his previous troponin from [facility] was 2.210. Him patient's [place] CPK [place] [address], CK-MB 173.8. EKG done in the ED shows no acute changes.  Clinical Impression  Pt presents with generalized weakness and mild gait instability consistent with being mostly in the bed for the past few days.  I believe once he gets up and mobilizes more he will return to baseline.  O2 sats and HR were stable throughout session on RA with no DOE.      PT Assessment  Patent does not need any further PT services    Follow Up Recommendations  No PT follow up;Supervision - Intermittent    Does the patient have the potential to tolerate intense rehabilitation     NA  Barriers to Discharge  None      Equipment Recommendations  None recommended by PT    Recommendations for Other Services   None      Precautions / Restrictions Precautions Precautions: Fall;Other (comment) (nectar thick liquids and dysphasia diet)   Pertinent Vitals/Pain HR 110s, O2 sats 90s with gait on RA.  No DOE, back pain better after walking.       Mobility  Bed Mobility Bed Mobility: Supine to Sit;Sitting - Scoot to Edge of Bed;Sit to Supine Supine to Sit: 6: Modified independent (Device/Increase time);With rails;HOB elevated Sitting - Scoot to Edge of Bed: 6: Modified independent (Device/Increase time);With rail Sit to Supine: 6: Modified independent (Device/Increase time);With rail;HOB elevated Details for Bed Mobility Assistance: used railing to get to EOB, but did so quickly and easily Transfers Transfers: Sit to Stand;Stand to Sit Sit to Stand: 5: Supervision;With upper extremity assist;From bed Stand to Sit: 5: Supervision;With upper extremity assist;To bed Details for Transfer Assistance: supervision for safety Ambulation/Gait Ambulation/Gait Assistance: 4: Min guard Ambulation Distance (Feet): 200 Feet Assistive device: None Ambulation/Gait Assistance Details: min guard assist to stay close to pt for safety as he has mildly staggering and antalgic gait pattern Gait Pattern: Step-through pattern;Antalgic (mildly staggering) General Gait Details: pt O2 sats on RA with gait 93-98% on RA.  No DOE.  HR in the 110s throughout.         PT Goals(Current goals can be found in the care plan section) Acute Rehab PT Goals Patient Stated Goal: to get swallowing back to normal  Visit Information  Last PT Received On: 09/25/12 Assistance Needed: +1 History of Present Illness: 42 y.o. male admitted to [facility]  from [facility] on 09/21/12 with bipolar disorder, pulmonary embolism off anticoagulation after completion of 6 months on the Xarelto  as per patient, chronic back pain, chronic opiate use who was initially admitted to the [facility] on 09/20/2012 after patient was found to be unresponsive by his  mother. Patient has been taking oxycodone, Neurontin and was recently started on amitriptyline 150 mg by mouth daily. Patient was intubated and sedated to protect the airway, today patient left AMA from [facility] after they told patient that he tried to commit suicide, as per patient. Patient came to the [facility], and was found to be hypoxic with O2 sats in 80s, urinary retention With bladder scan showing 600 cc of urine. The lab work done in the ED showed significant elevation of troponin of 2.57, his previous troponin from [facility] was 2.210. Him patient's total CPK is 238[address], CK-MB 173.8. EKG done in the ED shows no acute changes.       Prior Functioning  Home Living Family/patient expects to be discharged to:: Private residence Living Arrangements: Alone (pt split from his 35 y.o. wife) Available Help at Discharge: Family;Available PRN/intermittently (mother lives across town) Type of Home: Apartment Home Access: Level entry Home Layout: One level Home Equipment: None Prior Function Level of Independence: Independent Communication Communication: Other (comment) (hoarse voice quality)    Cognition  Cognition Arousal/Alertness: Awake/alert Behavior During Therapy: WFL for tasks assessed/performed Overall Cognitive Status: Within Functional Limits for tasks assessed    Extremity/Trunk Assessment Upper Extremity Assessment Upper Extremity Assessment: Generalized weakness Lower Extremity Assessment Lower Extremity Assessment: Generalized weakness Cervical / Trunk Assessment Cervical / Trunk Assessment: Normal      End of Session PT - End of Session Activity Tolerance: Patient limited by fatigue;Patient limited by pain Patient left: in bed;with call bell/phone within reach;with family/visitor present (mother) Nurse Communication: Mobility status    Lurena Joiner B. Karlee Staff, PT, DPT [phone]   09/25/2012, 1:50 PM

## 2012-09-25 NOTE — Progress Notes (Addendum)
PATIENT DETAILS Name: Bradley Day Age: 42 y.o. Sex: male Date of Birth: [birth year] Admit Date: 09/21/2012 Admitting Physician Meredeth Ide, MD PCP:No primary provider on file.  Subjective: He continues to complain of throat soreness and hoarseness and states the soreness has gotten worse today.  He also continues to have lower back pain.    Assessment/Plan:  Rhabdomyolysis  -multifactorial: primarily medication related from pre admit Metformin in setting of oversedation from usual pain meds and prolonged immobility without movement x 2.5 hours (completely immobile per pt mom)  -CPK slow trending down (24576 on 8/4, 10815 on 8/5)  -resume IVF at rate of 125/hr  -would NOT resume Metformin upon dc   AKI (acute kidney injury)  -due to rhabdo  -urinary retention was transient and 2/2 narcs  -Acute kidney injury resolved.  ?NSTEMI - doubt  -Cards following  -they suspect mild demand ischemia  -TNI is trending down and appears solely due to ARF/rhabdomyolosis  -ECHO shows grade II diastolic dysfunction with an ejection fraction of 45-50%  -Lexiscan myoview showed no inducible ischemia and LVEF of around 38-40% -Cardiology recommended beta blockers, may be an ACEI in 2-3 weeks after he recovers from his ARF.   Acute respiratory failure with hypoxia and mild hypercarbia in known COPD  -has been smoking since age 13  -CXR and ABG c/w COPD  -cont supportive care   Diabetes mellitus  -was on Metformin pre admit- would stop PERMANENTLY given presentation with rhabdomyolosis  -HgbA1C is 5.7 (09/22/12)   Laryngitis  -onset after intubation  -RN says pt coughs/chokes with attempts to swallow  -SLP evaluated-objective swallow evaluation is pending. Patient on Dys 2 nectar thick.  -prn Chloraseptic spray-see how he does -Appreciate Dr. Lucky Rathke help, direct laryngoscopy showed normal vocal cords, no arytenoid dislocation. -We'll start on IV steroids see if it can help the laryngeal  inflammation.  Chronic back pain  -Continues to complain of lower back pain, stating he is not getting enough pain meds -Cautious use of narcs and neuropathic agents -Gave one dose of Fentanyl to see if patient has any relief  Transaminitis  -likely due to low perfusion and possible hypotension pre admit and rhabdo  -trending down   Hypokalemia  -3.4 on 8/4, 4.1 on 8/5  -Repeat prn  Disposition: Remain inpatient  DVT Prophylaxis: Prophylactic Heparin  Code Status: Full code  Family Communication Mother at bedside this AM  Procedures:  None  CONSULTS:  ENT   MEDICATIONS: Scheduled Meds: . ALPRAZolam  0.5 mg Oral TID  . aspirin EC  81 mg Oral Daily  . feeding supplement  1 Container Oral TID BM  . heparin subcutaneous  5,000 Units Subcutaneous Q8H  . insulin aspart  0-9 Units Subcutaneous TID WC  . metoprolol tartrate  12.5 mg Oral BID  . nicotine  14 mg Transdermal Daily   Continuous Infusions:  PRN Meds:.sodium chloride, acetaminophen, oxyCODONE, phenol, RESOURCE THICKENUP CLEAR  Antibiotics: Anti-infectives   None       PHYSICAL EXAM: Vital signs in last 24 hours: Filed Vitals:   09/24/12 1207 09/24/12 1452 09/24/12 2123 09/25/12 0514  BP: 99/63 132/57 107/59 98/62  Pulse:  88 107 111  Temp:  98 F (36.7 C) 98.9 F (37.2 C) 98.3 F (36.8 C)  TempSrc:  Oral Oral Oral  Resp:  20 18 20   Height:      Weight:      SpO2:  96% 94% 93%    Weight change:  [employer]  09/21/12 2031  Weight: 81.647 kg (180 lb)   Body mass index is 23.75 kg/(m^2).   Gen Exam: Awake and alert with clear speech. Neck: Supple, No JVD. Chest: B/L Clear. CVS: S1 S2 Regular, no murmurs. Abdomen: soft, BS +, non tender, non distended. Extremities: no edema, lower extremities warm to touch. Neurologic: Non Focal. Skin: No Rash.  Intake/Output from previous day:  Intake/Output Summary (Last 24 hours) at 09/25/12 1058 Last data filed at 09/24/12 1300  Gross  per 24 hour  Intake      0 ml  Output      0 ml  Net      0 ml     LAB RESULTS: CBC  Recent Labs Lab 09/21/12 2124 09/22/12 0425 09/22/12 0820 09/23/12 0520 09/25/12 0705  WBC 12.5* 12.4* 9.7 8.5 9.8  HGB 10.7* 11.1* 10.8* 12.5* 14.4  HCT 31.4* 32.3* 31.3* 36.0* 39.6  PLT 235 257 252 286 365  MCV 89.2 88.7 89.2 88.2 86.8  MCH 30.4 30.5 30.8 30.6 31.6  MCHC 34.1 34.4 34.5 34.7 36.4*  RDW 13.0 13.0 13.1 12.5 12.6  LYMPHSABS 1.5  --   --   --   --   MONOABS 1.2*  --   --   --   --   EOSABS 0.1  --   --   --   --   BASOSABS 0.0  --   --   --   --     Chemistries   Recent Labs Lab 09/21/12 2124 09/22/12 0425 09/23/12 0520 09/25/12 0705  NA 134* 138 138 136  K 3.2* 3.4* 4.1 4.4  CL 94* 96 98 98  CO2 32 32 30 24  GLUCOSE 117* 105* 105* 111*  BUN 12 13 11 16   CREATININE 1.80* 1.67* 1.26 1.05  CALCIUM 8.1* 8.2* 9.2 9.7    CBG:  Recent Labs Lab 09/24/12 0751 09/24/12 1448 09/24/12 1702 09/24/12 2212 09/25/12 0718  GLUCAP 105* 90 103* 119* 113*    GFR Estimated Creatinine Clearance: 102.5 ml/min (by C-G formula based on Cr of 1.05).  Cardiac Enzymes  Recent Labs Lab 09/21/12 2124 09/22/12 0130 09/22/12 0820  CKMB  --   --  27.8*  TROPONINI 2.57* 1.87* 1.17*     Recent Labs  09/22/12 1458  HGBA1C 5.7*    MICROBIOLOGY: Recent Results (from the past 240 hour(s))  CULTURE, BLOOD (ROUTINE X 2)     Status: None   Collection Time    09/21/12  9:24 PM      Result Value Range Status   Specimen Description LEFT ANTECUBITAL   Final   Special Requests BOTTLES DRAWN AEROBIC AND ANAEROBIC 8CC EACH   Final   Culture NO GROWTH 4 DAYS   Final   Report Status PENDING   Incomplete  CULTURE, BLOOD (ROUTINE X 2)     Status: None   Collection Time    09/21/12  9:30 PM      Result Value Range Status   Specimen Description BLOOD RIGHT HAND   Final   Special Requests BOTTLES DRAWN AEROBIC AND ANAEROBIC 6CC EACH   Final   Culture NO GROWTH 4 DAYS   Final    Report Status PENDING   Incomplete  URINE CULTURE     Status: None   Collection Time    09/21/12 11:04 PM      Result Value Range Status   Specimen Description URINE, CATHETERIZED   Final   Special Requests NONE  Final   Culture  Setup Time     Final   Value: 09/22/2012 04:40     Performed at [facility] Count     Final   Value: NO GROWTH     Performed at [facility]   Culture     Final   Value: NO GROWTH     Performed at [facility]   Report Status 09/23/2012 FINAL   Final  MRSA PCR SCREENING     Status: None   Collection Time    09/22/12  1:41 AM      Result Value Range Status   MRSA by PCR NEGATIVE  NEGATIVE Final   Comment:            The GeneXpert MRSA Assay (FDA     approved for NASAL specimens     only), is one component of a     comprehensive MRSA colonization     surveillance program. It is not     intended to diagnose MRSA     infection nor to guide or     monitor treatment for     MRSA infections.    RADIOLOGY STUDIES/RESULTS: Nm Myocar Multi W/spect W/wall Motion / Ef  09/24/2012   *RADIOLOGY REPORT*  Clinical Data:  Chest pain  MYOCARDIAL IMAGING WITH SPECT (REST AND PHARMACOLOGIC-STRESS) GATED LEFT VENTRICULAR WALL MOTION STUDY LEFT VENTRICULAR EJECTION FRACTION  Technique:  Standard myocardial SPECT imaging was performed after resting intravenous injection of 10 mCi Tc-2m Sestamibi. Subsequently, intravenous infusion of regadenoson was performed under the supervision of the Cardiology staff.  At peak effect of the drug, 30 mCi Tc-73m Sestamibi was injected intravenously and standard myocardial SPECT  imaging was performed.  Quantitative gated imaging was also performed to evaluate left ventricular wall motion, and estimate left ventricular ejection fraction.  Comparison:  None.  Findings: The Lexiscan stress ECG will be reported elsewhere in the chart.  Gated images showed EF 37% with global hypokinesis.  The rest perfusion  study was count-poor.  The stress images showed a mild, small basal to mid inferior perfusion defect.  There was significant subdiaphragmatic radiotracer uptake.  IMPRESSION: 1. EF 37% with global hypokinesis.  2. The rest study was count-poor.  There was a primarily fixed, small, mild basal to mid inferior perfusion defect.  This may have been due to attenuation artifact related to the subdiaphragmatic radiotracer uptake.  There is no ischemia.  3. Suspect nonischemic cardiomyopathy.  Overall intermediate risk study due to decreased EF but no ischemia noted.   Original Report Authenticated By: Marca Ancona   Dg Chest Port 1 View  09/21/2012   *RADIOLOGY REPORT*  Clinical Data: Hypoxia, COPD  PORTABLE CHEST - 1 VIEW  Comparison: 02/12/2012  Findings: Diffuse interstitial edema pattern throughout the lungs compatible with CHF.  Bibasilar atelectasis also evident.  No large effusion or pneumothorax.  Trachea midline.  IMPRESSION: Mild diffuse interstitial edema and basilar atelectasis.   Original Report Authenticated By: Judie Petit. Miles Costain, M.D.    Rema Fendt, PA-S  [facility]   If 7PM-7AM, please contact night-coverage [email or web address] Password [email or web address] 09/25/2012, 10:58 AM   LOS: 4 days   Addendum  Patient seen and examined, chart and data base reviewed.  I agree with the above assessment and plan.  For full details please see Ms. Rema Fendt, PA-S note.  Addended as needed   Clint Lipps, MD [facility] Pager: [phone] 09/25/2012, 1:44 PM

## 2012-09-25 NOTE — Progress Notes (Signed)
Notified Craige Cotta, NP that patient stated that he coughed up the oxycodone and Ambien and that nothing will go down.  Patient requesting IV pain medication. No new orders given. Will continue to monitor patient. Nelda Marseille, RN

## 2012-09-25 NOTE — Consult Note (Signed)
Reason for Consult: Voice and swallowing problems Referring Physician: Clydia Llano, MD  Bradley Day is an 42 y.o. male.  HPI: Patient was emergently intubated last Saturday at [facility], and on ventilator over night. He self-extubated and signed out AMA. Since extubation, he has had very hoarse and weak voice and trouble swallowing. He gets choked every time he tries to eat or drink. No prior history of throat problems. Extensive PMH reviewed from chart.  Past Medical History  Diagnosis Date  . Emphysema   . Depression   . Bipolar 1 disorder   . Pulmonary embolism August 2013    off anticoagulation 12/2011 per pt  . IV drug user     last IV drug use in 2011    Past Surgical History  Procedure Laterality Date  . Hand surgery      History reviewed. No pertinent family history.  Social History:  reports that he has been smoking Cigarettes.  He has been smoking about 1.00 pack per day. He does not have any smokeless tobacco history on file. He reports that he uses illicit drugs (Cocaine, Marijuana, and IV). His alcohol history is not on file.  Allergies:  Allergies  Allergen Reactions  . Augmentin (Amoxicillin-Pot Clavulanate)     unknown  . Toradol (Ketorolac Tromethamine)     Severe headaches     Medications: Reviewed  Results for orders placed during the hospital encounter of 09/21/12 (from the past 48 hour(s))  GLUCOSE, CAPILLARY     Status: Abnormal   Collection Time    09/23/12  4:27 PM      Result Value Range   Glucose-Capillary 102 (*) 70 - 99 mg/dL  GLUCOSE, CAPILLARY     Status: Abnormal   Collection Time    09/23/12  9:55 PM      Result Value Range   Glucose-Capillary 108 (*) 70 - 99 mg/dL   Comment 1 Notify RN     Comment 2 Documented in Chart    GLUCOSE, CAPILLARY     Status: Abnormal   Collection Time    09/24/12  7:51 AM      Result Value Range   Glucose-Capillary 105 (*) 70 - 99 mg/dL  GLUCOSE, CAPILLARY     Status: None   Collection  Time    09/24/12  2:48 PM      Result Value Range   Glucose-Capillary 90  70 - 99 mg/dL  GLUCOSE, CAPILLARY     Status: Abnormal   Collection Time    09/24/12  5:02 PM      Result Value Range   Glucose-Capillary 103 (*) 70 - 99 mg/dL  GLUCOSE, CAPILLARY     Status: Abnormal   Collection Time    09/24/12 10:12 PM      Result Value Range   Glucose-Capillary 119 (*) 70 - 99 mg/dL   Comment 1 Notify RN     Comment 2 Documented in Chart    CBC     Status: Abnormal   Collection Time    09/25/12  7:05 AM      Result Value Range   WBC 9.8  4.0 - 10.5 K/uL   RBC 4.56  4.22 - 5.81 MIL/uL   Hemoglobin 14.4  13.0 - 17.0 g/dL   HCT 78.4  69.6 - 29.5 %   MCV 86.8  78.0 - 100.0 fL   MCH 31.6  26.0 - 34.0 pg   MCHC 36.4 (*) 30.0 - 36.0 g/dL  RDW 12.6  11.5 - 15.5 %   Platelets 365  150 - 400 K/uL  COMPREHENSIVE METABOLIC PANEL     Status: Abnormal   Collection Time    09/25/12  7:05 AM      Result Value Range   Sodium 136  135 - 145 mEq/L   Potassium 4.4  3.5 - 5.1 mEq/L   Chloride 98  96 - 112 mEq/L   CO2 24  19 - 32 mEq/L   Glucose, Bld 111 (*) 70 - 99 mg/dL   BUN 16  6 - 23 mg/dL   Creatinine, Ser 4.13  0.50 - 1.35 mg/dL   Calcium 9.7  8.4 - 24.4 mg/dL   Total Protein 7.5  6.0 - 8.3 g/dL   Albumin 3.1 (*) 3.5 - 5.2 g/dL   AST 77 (*) 0 - 37 U/L   ALT 115 (*) 0 - 53 U/L   Alkaline Phosphatase 88  39 - 117 U/L   Total Bilirubin 0.3  0.3 - 1.2 mg/dL   GFR calc non Af Amer 85 (*) >90 mL/min   GFR calc Af Amer >90  >90 mL/min   Comment:            The eGFR has been calculated     using the CKD EPI equation.     This calculation has not been     validated in all clinical     situations.     eGFR's persistently     <90 mL/min signify     possible Chronic Kidney Disease.  GLUCOSE, CAPILLARY     Status: Abnormal   Collection Time    09/25/12  7:18 AM      Result Value Range   Glucose-Capillary 113 (*) 70 - 99 mg/dL    Nm Myocar Multi W/spect W/wall Motion / Ef  09/24/2012    *RADIOLOGY REPORT*  Clinical Data:  Chest pain  MYOCARDIAL IMAGING WITH SPECT (REST AND PHARMACOLOGIC-STRESS) GATED LEFT VENTRICULAR WALL MOTION STUDY LEFT VENTRICULAR EJECTION FRACTION  Technique:  Standard myocardial SPECT imaging was performed after resting intravenous injection of 10 mCi Tc-68m Sestamibi. Subsequently, intravenous infusion of regadenoson was performed under the supervision of the Cardiology staff.  At peak effect of the drug, 30 mCi Tc-58m Sestamibi was injected intravenously and standard myocardial SPECT  imaging was performed.  Quantitative gated imaging was also performed to evaluate left ventricular wall motion, and estimate left ventricular ejection fraction.  Comparison:  None.  Findings: The Lexiscan stress ECG will be reported elsewhere in the chart.  Gated images showed EF 37% with global hypokinesis.  The rest perfusion study was count-poor.  The stress images showed a mild, small basal to mid inferior perfusion defect.  There was significant subdiaphragmatic radiotracer uptake.  IMPRESSION: 1. EF 37% with global hypokinesis.  2. The rest study was count-poor.  There was a primarily fixed, small, mild basal to mid inferior perfusion defect.  This may have been due to attenuation artifact related to the subdiaphragmatic radiotracer uptake.  There is no ischemia.  3. Suspect nonischemic cardiomyopathy.  Overall intermediate risk study due to decreased EF but no ischemia noted.   Original Report Authenticated By: Marca Ancona    WNU:[id number] except as listed in admit H&P  Blood pressure 98/62, pulse 111, temperature 98.3 F (36.8 C), temperature source Oral, resp. rate 20, height 6\' 1"  (1.854 m), weight 180 lb (81.647 kg), SpO2 93.00%.  PHYSICAL EXAM: Overall appearance:  Healthy appearing, in no distress, voice  breathy and hoarse. He is able to generate a moderately strong cough with a partial glottic stop.  Head:  Normocephalic, atraumatic. Ears: External ears look  normal. Nose: External nose is healthy in appearance. Internal nasal exam free of any lesions or obstruction. Oral Cavity:  There are no mucosal lesions or masses identified. Oral Pharynx/Hypopharynx/Larynx: no signs of any mucosal lesions or masses identified.  Neuro:  No identifiable neurologic deficits. Neck: No palpable neck masses.  Studies Reviewed: none  Procedures: Flexible fiberoptic exam of the larynx performed following topical lidocaine viscous application in the nasal cavities.   Findings:  Vocal cords are mobile and appose in the midline. No masses identified. No signs of airway obstruction.There is diffuse and severe edema of the larynx and hypopharynx.    Assessment/Plan: Laryngeal injury from traumatic extubation. No evidence of cord paralysis or arytenoid dislocation. I suspect this will continue to improve over time. If he is still having trouble after another 2 weeks, I can re-examine him either in hospital or in the office.  Lane Eland 09/25/2012, 12:15 PM

## 2012-09-26 DIAGNOSIS — I428 Other cardiomyopathies: Secondary | ICD-10-CM

## 2012-09-26 LAB — GLUCOSE, CAPILLARY: Glucose-Capillary: 267 mg/dL — ABNORMAL HIGH (ref 70–99)

## 2012-09-26 LAB — CULTURE, BLOOD (ROUTINE X 2): Culture: NO GROWTH

## 2012-09-26 MED ORDER — PREDNISONE (PAK) 10 MG PO TABS: ORAL_TABLET | ORAL | Status: DC

## 2012-09-26 MED ORDER — METOPROLOL TARTRATE 12.5 MG HALF TABLET: 12.5000 mg | ORAL_TABLET | Freq: Two times a day (BID) | ORAL | Status: DC

## 2012-09-26 MED ORDER — LIDOCAINE VISCOUS 2 % MT SOLN: 15.0000 mL | OROMUCOSAL | Status: DC | PRN

## 2012-09-26 NOTE — H&P (Addendum)
Physician Discharge Summary  Bradley Day ZOX:[id number] DOB: 01-24-1971 DOA: 09/21/2012  PCP: Bing Plume, DO, [place], [place]  Please note that patient requested his medical records NOT to be faxed to his primary care physician.  Admit date: 09/21/2012 Discharge date: 09/26/2012  Time spent: 40 minutes  Recommendations for Outpatient Follow-up:  1. Followup with primary care physician within one week. 2. Needs to determine about oral hypoglycemics  Discharge Diagnoses:  Active Problems:   Rhabdomyolysis   AKI (acute kidney injury)   NSTEMI (non-ST elevated myocardial infarction)   Transaminitis   Hypokalemia   Diabetes mellitus   Chronic back pain   Nonischemic cardiomyopathy   Discharge Condition: Stable  Diet recommendation: Heart healthy diet  Filed Weights   09/21/12 2031  Weight: 81.647 kg (180 lb)    History of present illness:  42 year old male with a history of bipolar disorder, pulmonary embolism off anticoagulation after completion of 6 months on the Xarelto as per patient, chronic back pain, chronic opiate use who was initially admitted to the [facility] hospital on 09/20/2012 after patient was found to be unresponsive by his mother. Patient has been taking oxycodone, Neurontin and was recently started on amitriptyline 150 mg by mouth daily. Patient was intubated and sedated to protect the airway, today patient left AMA from [facility] after they told patient that he tried to commit suicide, as per patient. Patient came to the [facility], and was found to be hypoxic with O2 sats in 80s, urinary retention With bladder scan showing 600 cc of urine. The lab work done in the ED showed significant elevation of troponin of 2.57, his previous troponin from [facility] was 2.210. Him patient's total CPK is 23887, CK-MB 173.8. EKG done in the ED shows no acute changes.  At this time patient denies chest pain, no shortness of breath his  O2 sats have improved in the ED. Appears very restless, denies nausea vomiting or diarrhea. Admits to having some dysuria. No fever  Hospital Course:   1. NSTEMI: Patient presents to the hospital with elevated troponin of 2.57, his previous troponin from [facility] was 2.2. The time of admission cardiology was consulted, this is thought to be secondary to demand ischemia. Patient did have recent cardiopulmonary arrest assumed to be secondary to narcotics overdose with intubation and self extubation. Echocardiogram was done and showed grade 2 diastolic dysfunction with global hypokinesis. LexiScan Myoview stress test was done and showed no inducible ischemia LVEF of around 38-40%. Cardiology recommended 12.5 mg of metoprolol twice a day,  ACE inhibitor should be started in 2-3 weeks after his kidneys recover slowly from the ARF.   2. Rhabdomyolysis: Multifactorial primarily secondary to prolonged immobility after the patient had the cardiopulmonary arrest. CPK was 24,000 on admission was trending down. Patient had IV fluid of admission aggressive IV fluid hydration was continued. Metformin was discontinued.  3. Acute renal failure: Likely secondary to rhabdomyolysis, patient has transient urinary retention likely secondary to narcotics. Acute renal failure resolved after aggressive hydration with IV fluids.  4. Acute respiratory failure with hypoxia: Patient likely has COPD since he is smoking since his 42 year old. As mentioned above patient was presented to [facility] with hypoxic respiratory failure, patient was intubated and he self extubated. This is resolved. Gabapentin was discontinued prior to discharge.  5. Diabetes mellitus: Patient was on metformin prior to admission, this was discontinued because of the presentation with rhabdomyolysis. Hemoglobin A1c is 5.7 on 09/22/2012.  Patient was discharged on a steroid taper for 7 days, his blood sugar expected to be in  the high side. Patient to followup with his primary care physician for further recommendation regarding is diabetes regimen.  6. Laryngitis: Patient suffers from hoarseness of voice and difficulty with swallowing after they traumatic extubation. Its and documented in the records and unclear if patient self extubated on he has had traumatic extubation. Patient said he was sleepy and he did not know what happened. Patient seen by ENT Dr. Pollyann Kennedy and the direct laryngoscopy showed no evidence of chord paralysis or joint dislocation but there is swelling from the traumatic extubation. Prednisone was given to decrease the inflammation/swelling and lidocaine viscous was prescribed on discharge to help with her swallowing.  7. Chronic pain: Patient follows with Dr. Vilma Prader, he reported that he is on 30 mg of OxyIR every 4 hours. He does not want any pain medication prescribed to him as that will affect his pain contract. Patient to followup with his primary care physician.  8. Nonischemic cardiomyopathy: As mentioned above patient 2-D echocardiogram showed global hypokinesis, the stress Myoview did not show any inducible ischemia. Patient to followup with cardiology, discharge on 12.5 mg of metoprolol, per Dr. Swaziland recommendation ACE inhibitor should be introduced in 2-3 weeks after his kidneys recover.  Procedures:  Direct laryngoscopy done by Dr. Pollyann Kennedy, showed laryngeal injury from traumatic extubation, no evidence of chord paralysis or arytenoid dislocation.  Consultations:  Serena Colonel of ENT.  Peter Swaziland of cardiology  Discharge Exam: Filed Vitals:   09/26/12 0519  BP: 125/79  Pulse: 76  Temp: 97.6 F (36.4 C)  Resp: 18   General: Alert and awake, oriented x3, not in any acute distress. HEENT: anicteric sclera, pupils reactive to light and accommodation, EOMI CVS: S1-S2 clear, no murmur rubs or gallops Chest: clear to auscultation bilaterally, no wheezing, rales or rhonchi Abdomen: soft  nontender, nondistended, normal bowel sounds, no organomegaly Extremities: no cyanosis, clubbing or edema noted bilaterally Neuro: Cranial nerves II-XII intact, no focal neurological deficits  Discharge Instructions      Discharge Orders   Future Orders Complete By Expires     Diet - low sodium heart healthy  As directed     Increase activity slowly  As directed         Medication List    STOP taking these medications       gabapentin 800 MG tablet  Commonly known as:  NEURONTIN     metFORMIN 500 MG tablet  Commonly known as:  GLUCOPHAGE      TAKE these medications       albuterol 108 (90 BASE) MCG/ACT inhaler  Commonly known as:  PROVENTIL HFA;VENTOLIN HFA  Inhale 2 puffs into the lungs every 6 (six) hours as needed for wheezing or shortness of breath.     ALPRAZolam 0.5 MG tablet  Commonly known as:  XANAX  Take 0.5 mg by mouth 3 (three) times daily.     amitriptyline 150 MG tablet  Commonly known as:  ELAVIL  Take 150 mg by mouth at bedtime.     lidocaine 2 % solution  Commonly known as:  XYLOCAINE  Take 15 mLs by mouth as needed for pain.     metoprolol tartrate 12.5 mg Tabs tablet  Commonly known as:  LOPRESSOR  Take 0.5 tablets (12.5 mg total) by mouth 2 (two) times daily.     omeprazole 20 MG capsule  Commonly known as:  PRILOSEC  Take 20 mg by mouth daily.     oxycodone 30 MG immediate release tablet  Commonly known as:  ROXICODONE  Take 30 mg by mouth 6 (six) times daily.     predniSONE 10 MG tablet  Commonly known as:  STERAPRED UNI-PAK  Take 6-5-4-3-2-1 tab PO daily till gone     tiotropium 18 MCG inhalation capsule  Commonly known as:  SPIRIVA  Place 18 mcg into inhaler and inhale daily.       Allergies  Allergen Reactions  . Augmentin (Amoxicillin-Pot Clavulanate)     unknown  . Toradol (Ketorolac Tromethamine)     Severe headaches    Follow-up Information   Call Serena Colonel, MD. (If symptoms worsen)    Contact information:    [address] [place] [address] [phone]        The results of significant diagnostics from this hospitalization (including imaging, microbiology, ancillary and laboratory) are listed below for reference.    Significant Diagnostic Studies: Nm Myocar Multi W/spect W/wall Motion / Ef  09/24/2012   *RADIOLOGY REPORT*  Clinical Data:  Chest pain  MYOCARDIAL IMAGING WITH SPECT (REST AND PHARMACOLOGIC-STRESS) GATED LEFT VENTRICULAR WALL MOTION STUDY LEFT VENTRICULAR EJECTION FRACTION  Technique:  Standard myocardial SPECT imaging was performed after resting intravenous injection of 10 mCi Tc-2m Sestamibi. Subsequently, intravenous infusion of regadenoson was performed under the supervision of the Cardiology staff.  At peak effect of the drug, 30 mCi Tc-87m Sestamibi was injected intravenously and standard myocardial SPECT  imaging was performed.  Quantitative gated imaging was also performed to evaluate left ventricular wall motion, and estimate left ventricular ejection fraction.  Comparison:  None.  Findings: The Lexiscan stress ECG will be reported elsewhere in the chart.  Gated images showed EF 37% with global hypokinesis.  The rest perfusion study was count-poor.  The stress images showed a mild, small basal to mid inferior perfusion defect.  There was significant subdiaphragmatic radiotracer uptake.  IMPRESSION: 1. EF 37% with global hypokinesis.  2. The rest study was count-poor.  There was a primarily fixed, small, mild basal to mid inferior perfusion defect.  This may have been due to attenuation artifact related to the subdiaphragmatic radiotracer uptake.  There is no ischemia.  3. Suspect nonischemic cardiomyopathy.  Overall intermediate risk study due to decreased EF but no ischemia noted.   Original Report Authenticated By: Marca Ancona   Dg Chest Port 1 View  09/21/2012   *RADIOLOGY REPORT*  Clinical Data: Hypoxia, COPD  PORTABLE CHEST - 1 VIEW  Comparison: 02/12/2012   Findings: Diffuse interstitial edema pattern throughout the lungs compatible with CHF.  Bibasilar atelectasis also evident.  No large effusion or pneumothorax.  Trachea midline.  IMPRESSION: Mild diffuse interstitial edema and basilar atelectasis.   Original Report Authenticated By: Judie Petit. Miles Costain, M.D.    Microbiology: Recent Results (from the past 240 hour(s))  CULTURE, BLOOD (ROUTINE X 2)     Status: None   Collection Time    09/21/12  9:24 PM      Result Value Range Status   Specimen Description LEFT ANTECUBITAL   Final   Special Requests BOTTLES DRAWN AEROBIC AND ANAEROBIC 8CC EACH   Final   Culture NO GROWTH 4 DAYS   Final   Report Status PENDING   Incomplete  CULTURE, BLOOD (ROUTINE X 2)     Status: None   Collection Time    09/21/12  9:30 PM      Result Value  Range Status   Specimen Description BLOOD RIGHT HAND   Final   Special Requests BOTTLES DRAWN AEROBIC AND ANAEROBIC 6CC EACH   Final   Culture NO GROWTH 4 DAYS   Final   Report Status PENDING   Incomplete  URINE CULTURE     Status: None   Collection Time    09/21/12 11:04 PM      Result Value Range Status   Specimen Description URINE, CATHETERIZED   Final   Special Requests NONE   Final   Culture  Setup Time     Final   Value: 09/22/2012 04:40     Performed at [facility] Count     Final   Value: NO GROWTH     Performed at [facility]   Culture     Final   Value: NO GROWTH     Performed at [facility]   Report Status 09/23/2012 FINAL   Final  MRSA PCR SCREENING     Status: None   Collection Time    09/22/12  1:41 AM      Result Value Range Status   MRSA by PCR NEGATIVE  NEGATIVE Final   Comment:            The GeneXpert MRSA Assay (FDA     approved for NASAL specimens     only), is one component of a     comprehensive MRSA colonization     surveillance program. It is not     intended to diagnose MRSA     infection nor to guide or     monitor treatment for     MRSA  infections.     Labs: Basic Metabolic Panel:  Recent Labs Lab 09/21/12 2124 09/22/12 0425 09/23/12 0520 09/25/12 0705  NA 134* 138 138 136  K 3.2* 3.4* 4.1 4.4  CL 94* 96 98 98  CO2 32 32 30 24  GLUCOSE 117* 105* 105* 111*  BUN 12 13 11 16   CREATININE 1.80* 1.67* 1.26 1.05  CALCIUM 8.1* 8.2* 9.2 9.7   Liver Function Tests:  Recent Labs Lab 09/21/12 2124 09/22/12 0425 09/23/12 0520 09/25/12 0705  AST 621* 520* 305* 77*  ALT 213* 203* 172* 115*  ALKPHOS 82 82 85 88  BILITOT 0.3 0.4 0.4 0.3  PROT 6.3 5.8* 6.2 7.5  ALBUMIN 2.9* 2.7* 2.7* 3.1*   No results found for this basename: LIPASE, AMYLASE,  in the last 168 hours No results found for this basename: AMMONIA,  in the last 168 hours CBC:  Recent Labs Lab 09/21/12 2124 09/22/12 0425 09/22/12 0820 09/23/12 0520 09/25/12 0705  WBC 12.5* 12.4* 9.7 8.5 9.8  NEUTROABS 9.7*  --   --   --   --   HGB 10.7* 11.1* 10.8* 12.5* 14.4  HCT 31.4* 32.3* 31.3* 36.0* 39.6  MCV 89.2 88.7 89.2 88.2 86.8  PLT 235 257 252 286 365   Cardiac Enzymes:  Recent Labs Lab 09/21/12 2124 09/22/12 0130 09/22/12 0425 09/22/12 0820 09/23/12 0520  CKTOTAL >20000*  --  [address]* 24576* 10815*  CKMB  --   --   --  27.8*  --   TROPONINI 2.57* 1.87*  --  1.17*  --    BNP: BNP (last 3 results)  Recent Labs  09/21/12 2124  PROBNP 3389.0*   CBG:  Recent Labs Lab 09/25/12 0718 09/25/12 1202 09/25/12 1659 09/25/12 2137 09/26/12 0734  GLUCAP 113* 97 116* 249* 166*  Signed:  Durand Wittmeyer A  [facility] 09/26/2012, 10:43 AM

## 2012-09-26 NOTE — Progress Notes (Signed)
Patient pleasant throughout night. Scheduled fentanyl relieving pain. Patient states throat pain decreasing. Per patient request, applied gauze over patient right eye. Patient broke eyeglasses. Patient states can see better with gauze over eye.

## 2012-09-26 NOTE — Progress Notes (Signed)
Pt. discharged to floor,verbalized understanding of discharged instruction,medication,restriction,diet and follow up appointment.Baseline Vitals sign stable,Pt comfortable,no sign and symptom of distress. 

## 2012-10-03 ENCOUNTER — Encounter (HOSPITAL_COMMUNITY): Payer: Self-pay | Admitting: Emergency Medicine

## 2012-10-03 ENCOUNTER — Inpatient Hospital Stay (HOSPITAL_COMMUNITY)
Admission: EM | Admit: 2012-10-03 | Discharge: 2012-10-06 | DRG: 555 | Disposition: A | Discharge status: Home / Self Care | Payer: MEDICAID | Attending: Internal Medicine | Admitting: Internal Medicine

## 2012-10-03 ENCOUNTER — Emergency Department (HOSPITAL_COMMUNITY): Payer: Self-pay

## 2012-10-03 DIAGNOSIS — F1123 Opioid dependence with withdrawal: Secondary | ICD-10-CM | POA: Diagnosis present

## 2012-10-03 DIAGNOSIS — Z7982 Long term (current) use of aspirin: Secondary | ICD-10-CM

## 2012-10-03 DIAGNOSIS — IMO0002 Reserved for concepts with insufficient information to code with codable children: Secondary | ICD-10-CM

## 2012-10-03 DIAGNOSIS — E876 Hypokalemia: Secondary | ICD-10-CM

## 2012-10-03 DIAGNOSIS — J438 Other emphysema: Secondary | ICD-10-CM | POA: Diagnosis present

## 2012-10-03 DIAGNOSIS — R29898 Other symptoms and signs involving the musculoskeletal system: Secondary | ICD-10-CM | POA: Diagnosis present

## 2012-10-03 DIAGNOSIS — E860 Dehydration: Secondary | ICD-10-CM | POA: Diagnosis present

## 2012-10-03 DIAGNOSIS — M51379 Other intervertebral disc degeneration, lumbosacral region without mention of lumbar back pain or lower extremity pain: Secondary | ICD-10-CM | POA: Diagnosis present

## 2012-10-03 DIAGNOSIS — F1193 Opioid use, unspecified with withdrawal: Secondary | ICD-10-CM | POA: Diagnosis present

## 2012-10-03 DIAGNOSIS — I428 Other cardiomyopathies: Secondary | ICD-10-CM | POA: Diagnosis present

## 2012-10-03 DIAGNOSIS — F17200 Nicotine dependence, unspecified, uncomplicated: Secondary | ICD-10-CM | POA: Diagnosis present

## 2012-10-03 DIAGNOSIS — F1120 Opioid dependence, uncomplicated: Secondary | ICD-10-CM | POA: Diagnosis present

## 2012-10-03 DIAGNOSIS — R110 Nausea: Secondary | ICD-10-CM | POA: Diagnosis present

## 2012-10-03 DIAGNOSIS — M549 Dorsalgia, unspecified: Secondary | ICD-10-CM | POA: Diagnosis present

## 2012-10-03 DIAGNOSIS — M5137 Other intervertebral disc degeneration, lumbosacral region: Secondary | ICD-10-CM | POA: Diagnosis present

## 2012-10-03 DIAGNOSIS — R1310 Dysphagia, unspecified: Secondary | ICD-10-CM | POA: Diagnosis present

## 2012-10-03 DIAGNOSIS — I509 Heart failure, unspecified: Secondary | ICD-10-CM | POA: Diagnosis present

## 2012-10-03 DIAGNOSIS — E119 Type 2 diabetes mellitus without complications: Secondary | ICD-10-CM | POA: Diagnosis present

## 2012-10-03 DIAGNOSIS — G8929 Other chronic pain: Secondary | ICD-10-CM | POA: Diagnosis present

## 2012-10-03 DIAGNOSIS — I959 Hypotension, unspecified: Secondary | ICD-10-CM | POA: Diagnosis present

## 2012-10-03 DIAGNOSIS — R339 Retention of urine, unspecified: Secondary | ICD-10-CM

## 2012-10-03 DIAGNOSIS — I252 Old myocardial infarction: Secondary | ICD-10-CM

## 2012-10-03 DIAGNOSIS — M609 Myositis, unspecified: Secondary | ICD-10-CM | POA: Diagnosis present

## 2012-10-03 DIAGNOSIS — N179 Acute kidney failure, unspecified: Secondary | ICD-10-CM

## 2012-10-03 DIAGNOSIS — M6282 Rhabdomyolysis: Secondary | ICD-10-CM | POA: Diagnosis present

## 2012-10-03 DIAGNOSIS — E43 Unspecified severe protein-calorie malnutrition: Secondary | ICD-10-CM | POA: Insufficient documentation

## 2012-10-03 DIAGNOSIS — I214 Non-ST elevation (NSTEMI) myocardial infarction: Secondary | ICD-10-CM

## 2012-10-03 DIAGNOSIS — F19939 Other psychoactive substance use, unspecified with withdrawal, unspecified: Secondary | ICD-10-CM | POA: Diagnosis present

## 2012-10-03 DIAGNOSIS — IMO0001 Reserved for inherently not codable concepts without codable children: Principal | ICD-10-CM | POA: Diagnosis present

## 2012-10-03 LAB — CBC WITH DIFFERENTIAL/PLATELET
Basophils Absolute: 0 10*3/uL (ref 0.0–0.1)
Basophils Relative: 0 % (ref 0–1)
Eosinophils Absolute: 0.2 10*3/uL (ref 0.0–0.7)
Eosinophils Relative: 2 % (ref 0–5)
HCT: 40.4 % (ref 39.0–52.0)
Hemoglobin: 13.8 g/dL (ref 13.0–17.0)
Lymphocytes Relative: 38 % (ref 12–46)
Lymphs Abs: 4.2 10*3/uL — ABNORMAL HIGH (ref 0.7–4.0)
MCH: 29.7 pg (ref 26.0–34.0)
MCHC: 34.2 g/dL (ref 30.0–36.0)
MCV: 87.1 fL (ref 78.0–100.0)
Monocytes Absolute: 0.8 10*3/uL (ref 0.1–1.0)
Monocytes Relative: 8 % (ref 3–12)
Neutro Abs: 5.7 10*3/uL (ref 1.7–7.7)
Neutrophils Relative %: 52 % (ref 43–77)
Platelets: 431 10*3/uL — ABNORMAL HIGH (ref 150–400)
RBC: 4.64 MIL/uL (ref 4.22–5.81)
RDW: 12.7 % (ref 11.5–15.5)
WBC: 11 10*3/uL — ABNORMAL HIGH (ref 4.0–10.5)

## 2012-10-03 LAB — URINALYSIS, ROUTINE W REFLEX MICROSCOPIC
Bilirubin Urine: NEGATIVE
Glucose, UA: NEGATIVE mg/dL
Hgb urine dipstick: NEGATIVE
Ketones, ur: NEGATIVE mg/dL
Leukocytes, UA: NEGATIVE
Nitrite: NEGATIVE
Protein, ur: NEGATIVE mg/dL
Specific Gravity, Urine: 1.017 (ref 1.005–1.030)
Urobilinogen, UA: 0.2 mg/dL (ref 0.0–1.0)
pH: 6.5 (ref 5.0–8.0)

## 2012-10-03 LAB — COMPREHENSIVE METABOLIC PANEL
ALT: 23 U/L (ref 0–53)
AST: 14 U/L (ref 0–37)
Albumin: 3.5 g/dL (ref 3.5–5.2)
Alkaline Phosphatase: 81 U/L (ref 39–117)
BUN: 18 mg/dL (ref 6–23)
CO2: 27 mEq/L (ref 19–32)
Calcium: 9.3 mg/dL (ref 8.4–10.5)
Chloride: 100 mEq/L (ref 96–112)
Creatinine, Ser: 0.75 mg/dL (ref 0.50–1.35)
GFR calc Af Amer: >90 mL/min (ref >90)
GFR calc non Af Amer: >90 mL/min (ref >90)
Glucose, Bld: 107 mg/dL — ABNORMAL HIGH (ref 70–99)
Potassium: 4.2 mEq/L (ref 3.5–5.1)
Sodium: 137 mEq/L (ref 135–145)
Total Bilirubin: 0.1 mg/dL — ABNORMAL LOW (ref 0.3–1.2)
Total Protein: 7.2 g/dL (ref 6.0–8.3)

## 2012-10-03 LAB — POCT I-STAT TROPONIN I: Troponin i, poc: 0 ng/mL (ref 0.00–0.08)

## 2012-10-03 LAB — GLUCOSE, CAPILLARY

## 2012-10-03 MED ORDER — OXYCODONE HCL 5 MG PO TABS
30.0000 mg | ORAL_TABLET | Freq: Every day | ORAL | Status: DC
  Administered 2012-10-03 – 2012-10-04 (×4): 30 mg via ORAL
  Filled 2012-10-03 (×4): qty 6

## 2012-10-03 MED ORDER — METHOCARBAMOL 100 MG/ML IJ SOLN
1000.0000 mg | Freq: Once | INTRAVENOUS | Status: AC
  Administered 2012-10-03: 1000 mg via INTRAVENOUS
  Filled 2012-10-03 (×2): qty 10

## 2012-10-03 MED ORDER — ACETAMINOPHEN 650 MG RE SUPP: 650.0000 mg | Freq: Four times a day (QID) | RECTAL | Status: DC | PRN

## 2012-10-03 MED ORDER — ALPRAZOLAM 0.5 MG PO TABS
0.5000 mg | ORAL_TABLET | Freq: Three times a day (TID) | ORAL | Status: DC
  Administered 2012-10-04: 0.5 mg via ORAL
  Filled 2012-10-03: qty 1

## 2012-10-03 MED ORDER — HYDROMORPHONE HCL PF 1 MG/ML IJ SOLN
1.0000 mg | INTRAMUSCULAR | Status: AC | PRN
  Administered 2012-10-03 (×2): 1 mg via INTRAVENOUS
  Filled 2012-10-03 (×2): qty 1

## 2012-10-03 MED ORDER — ALUM & MAG HYDROXIDE-SIMETH 200-200-20 MG/5ML PO SUSP
30.0000 mL | Freq: Four times a day (QID) | ORAL | Status: DC | PRN
  Administered 2012-10-04: 30 mL via ORAL
  Filled 2012-10-03: qty 30

## 2012-10-03 MED ORDER — ONDANSETRON HCL 4 MG PO TABS: 4.0000 mg | ORAL_TABLET | Freq: Four times a day (QID) | ORAL | Status: DC | PRN

## 2012-10-03 MED ORDER — PANTOPRAZOLE SODIUM 40 MG PO TBEC
40.0000 mg | DELAYED_RELEASE_TABLET | Freq: Every day | ORAL | Status: DC
  Administered 2012-10-03 – 2012-10-06 (×4): 40 mg via ORAL
  Filled 2012-10-03 (×4): qty 1

## 2012-10-03 MED ORDER — ONDANSETRON HCL 4 MG/2ML IJ SOLN
4.0000 mg | Freq: Once | INTRAMUSCULAR | Status: AC
  Administered 2012-10-03: 4 mg via INTRAVENOUS
  Filled 2012-10-03: qty 2

## 2012-10-03 MED ORDER — ONDANSETRON HCL 4 MG/2ML IJ SOLN: 4.0000 mg | Freq: Four times a day (QID) | INTRAMUSCULAR | Status: DC | PRN

## 2012-10-03 MED ORDER — HYDROMORPHONE HCL PF 1 MG/ML IJ SOLN
0.5000 mg | INTRAMUSCULAR | Status: DC | PRN
  Administered 2012-10-04: 1 mg via INTRAVENOUS
  Filled 2012-10-03: qty 1

## 2012-10-03 MED ORDER — BACITRACIN ZINC 500 UNIT/GM EX OINT
1.0000 "application " | TOPICAL_OINTMENT | Freq: Two times a day (BID) | CUTANEOUS | Status: DC
  Administered 2012-10-04 – 2012-10-06 (×5): 1 via TOPICAL
  Filled 2012-10-03 (×3): qty 0.9

## 2012-10-03 MED ORDER — GABAPENTIN 600 MG PO TABS
600.0000 mg | ORAL_TABLET | Freq: Four times a day (QID) | ORAL | Status: DC
  Administered 2012-10-04: 600 mg via ORAL
  Filled 2012-10-03 (×6): qty 1

## 2012-10-03 MED ORDER — ASPIRIN 81 MG PO TABS: 81.0000 mg | ORAL_TABLET | Freq: Every day | ORAL | Status: DC

## 2012-10-03 MED ORDER — ENOXAPARIN SODIUM 40 MG/0.4ML ~~LOC~~ SOLN
40.0000 mg | SUBCUTANEOUS | Status: DC
  Administered 2012-10-03 – 2012-10-05 (×3): 40 mg via SUBCUTANEOUS
  Filled 2012-10-03 (×4): qty 0.4

## 2012-10-03 MED ORDER — OXYCODONE HCL 5 MG PO TABS: 5.0000 mg | ORAL_TABLET | ORAL | Status: DC | PRN

## 2012-10-03 MED ORDER — TIOTROPIUM BROMIDE MONOHYDRATE 18 MCG IN CAPS
18.0000 ug | ORAL_CAPSULE | Freq: Every day | RESPIRATORY_TRACT | Status: DC
  Administered 2012-10-03 – 2012-10-06 (×3): 18 ug via RESPIRATORY_TRACT
  Filled 2012-10-03 (×2): qty 5

## 2012-10-03 MED ORDER — ALBUTEROL SULFATE HFA 108 (90 BASE) MCG/ACT IN AERS: 2.0000 | INHALATION_SPRAY | Freq: Four times a day (QID) | RESPIRATORY_TRACT | Status: DC | PRN

## 2012-10-03 MED ORDER — INSULIN ASPART 100 UNIT/ML ~~LOC~~ SOLN: 0.0000 [IU] | SUBCUTANEOUS | Status: DC

## 2012-10-03 MED ORDER — NICOTINE 21 MG/24HR TD PT24: 21.0000 mg | MEDICATED_PATCH | Freq: Every day | TRANSDERMAL | Status: DC

## 2012-10-03 MED ORDER — SODIUM CHLORIDE 0.9 % IV SOLN
INTRAVENOUS | Status: DC
  Administered 2012-10-03: 21:00:00 via INTRAVENOUS
  Administered 2012-10-04: 100 mL/h via INTRAVENOUS
  Administered 2012-10-04 – 2012-10-05 (×2): 1000 mL via INTRAVENOUS
  Administered 2012-10-05 (×2): via INTRAVENOUS

## 2012-10-03 MED ORDER — GADOBENATE DIMEGLUMINE 529 MG/ML IV SOLN
15.0000 mL | Freq: Once | INTRAVENOUS | Status: AC | PRN
  Administered 2012-10-03: 15 mL via INTRAVENOUS

## 2012-10-03 MED ORDER — ACETAMINOPHEN 325 MG PO TABS: 650.0000 mg | ORAL_TABLET | Freq: Four times a day (QID) | ORAL | Status: DC | PRN

## 2012-10-03 MED ORDER — ZOLPIDEM TARTRATE 5 MG PO TABS: 5.0000 mg | ORAL_TABLET | Freq: Every evening | ORAL | Status: DC | PRN

## 2012-10-03 NOTE — ED Notes (Signed)
MD James at bedside.  

## 2012-10-03 NOTE — ED Notes (Signed)
Attempted to call report x 1  

## 2012-10-03 NOTE — ED Notes (Signed)
Pt presents with generalized weakness since DC from hospital on 8/15. Pt reports he has only eating 1 cup of tomato soup, 1 piece of bread and a McFlurry since discharge. States lack of appetite, and 1 episode of emesis on Wednesday.Pt reports two falls since home due to weakness/lightheadedness. Denies injury/LOC.  Pt has continued chronic 10/10 lower back pain radiating down left leg. Pt reports "my pain management clinic dropped me because I violated my contract but this was my first time. Plus my doctor was worried about all my health issues. So they cut me off cold [place]."  Pt reports drinking hydrocodone cough syrup yesterday for pain management with no relief. Pt neuro intact, but generalized weakness noted. AO x 4. Denies nausea at this time.

## 2012-10-03 NOTE — ED Notes (Signed)
Pt states he was recently discharged from [facility] for heart and kidney problems. States for the last week he has been extremely weak, unable to eat or drink. 30lb weight loss since June. Pt alert, oriented x4, unable to stand. Denies chest pain or SOB at present.

## 2012-10-03 NOTE — ED Provider Notes (Addendum)
CSN: [id number]     Arrival date & time 10/03/12  1443 History     First MD Initiated Contact with Patient 10/03/12 1511     Chief Complaint  Patient presents with  . Weakness    HPI   Cortlandt has a fairly complicated recent medical history. About 10 days ago he was intubated after over ingestion of pain medications, amitriptyline, and gabapentin. Hasn't abated in [place] he awakened in the ICU extubated himself and left AMA. He presented any pin. His rhabdomyolysis congestive heart failure. Elevated transaminases. Had elevated troponin. Was transferred to [facility]. He improved. He was discharged 5 days ago. He's had difficulty getting up and around. He cannot bear weight on his left leg. He feels severe back pain and severe flank pain pain down the posterior aspect of his left leg. Past Medical History  Diagnosis Date  . Emphysema   . Depression   . Bipolar 1 disorder   . Pulmonary embolism August 2013    off anticoagulation 12/2011 per pt  . IV drug user     last IV drug use in 2011   Past Surgical History  Procedure Laterality Date  . Hand surgery     History reviewed. No pertinent family history. History  Substance Use Topics  . Smoking status: Current Every Day Smoker -- 1.00 packs/day    Types: Cigarettes  . Smokeless tobacco: Not on file  . Alcohol Use:      Comment: occasional    Review of Systems  Constitutional: Negative for fever, chills, diaphoresis, appetite change and fatigue.  HENT: Negative for sore throat, mouth sores and trouble swallowing.   Eyes: Negative for visual disturbance.  Respiratory: Negative for cough, chest tightness, shortness of breath and wheezing.   Cardiovascular: Negative for chest pain.  Gastrointestinal: Positive for nausea and vomiting. Negative for abdominal pain, diarrhea and abdominal distention.  Endocrine: Negative for polydipsia, polyphagia and polyuria.  Genitourinary: Negative for dysuria, frequency and hematuria.   Musculoskeletal: Positive for myalgias, back pain, arthralgias and gait problem.  Skin: Negative for color change, pallor and rash.  Neurological: Negative for dizziness, syncope, light-headedness and headaches.  Hematological: Does not bruise/bleed easily.  Psychiatric/Behavioral: Negative for behavioral problems and confusion.    Allergies  Augmentin and Toradol  Home Medications   Current Outpatient Rx  Name  Route  Sig  Dispense  Refill  . acetaminophen (TYLENOL) 325 MG tablet   Oral   Take 650 mg by mouth every 6 (six) hours as needed for pain.         Marland Kitchen albuterol (PROVENTIL HFA;VENTOLIN HFA) 108 (90 BASE) MCG/ACT inhaler   Inhalation   Inhale 2 puffs into the lungs every 6 (six) hours as needed for wheezing or shortness of breath.         . ALPRAZolam (XANAX) 0.5 MG tablet   Oral   Take 0.5 mg by mouth 3 (three) times daily.         Marland Kitchen aspirin 81 MG tablet   Oral   Take 81 mg by mouth daily.         Marland Kitchen gabapentin (NEURONTIN) 600 MG tablet   Oral   Take 600 mg by mouth 4 (four) times daily.         Marland Kitchen omeprazole (PRILOSEC) 20 MG capsule   Oral   Take 20 mg by mouth daily.         Marland Kitchen oxycodone (ROXICODONE) 30 MG immediate release tablet   Oral  Take 30 mg by mouth 6 (six) times daily.         Marland Kitchen tiotropium (SPIRIVA) 18 MCG inhalation capsule   Inhalation   Place 18 mcg into inhaler and inhale daily.         Marland Kitchen lidocaine (XYLOCAINE) 2 % solution   Oral   Take 15 mLs by mouth as needed for pain.   100 mL   0   . predniSONE (STERAPRED UNI-PAK) 10 MG tablet      Take 6-5-4-3-2-1 tab PO daily till gone   21 tablet   0    BP 96/53  Pulse 78  Temp(Src) 98.3 F (36.8 C) (Oral)  Resp 17  SpO2 98% Physical Exam  Constitutional: He is oriented to person, place, and time. No distress.  Interest in. His appear acutely ill or frank a cachectic.  HENT:  Head: Normocephalic.  Eyes: Conjunctivae are normal. Pupils are equal, round, and reactive to  light. No scleral icterus.  Neck: Normal range of motion. Neck supple. No thyromegaly present.  Cardiovascular: Normal rate and regular rhythm.  Exam reveals no gallop and no friction rub.   No murmur heard. Heart is a regular sinus. No murmurs.  Pulmonary/Chest: Effort normal and breath sounds normal. No respiratory distress. He has no wheezes. He has no rales.  Abdominal: Soft. Bowel sounds are normal. He exhibits no distension. There is no tenderness. There is no rebound.  Musculoskeletal: Normal range of motion.  Flexion extension left leg. He also leg and slight flexion. He is intact DTRs Achilles and knee jerk. His plantar flexion are some flexion. Most of his pain is manipulation of the hip or knee  Neurological: He is alert and oriented to person, place, and time.  DTRs are 2+ Achilles and knee jerk. Sensation to the left lower extremity. His pain is not colicky radicular pattern  Skin: Skin is warm and dry. No rash noted.  Psychiatric: He has a normal mood and affect. His behavior is normal.    ED Course   Procedures (including critical care time)  Labs Reviewed  CBC WITH DIFFERENTIAL - Abnormal; Notable for the following:    WBC 11.0 (*)    Platelets 431 (*)    Lymphs Abs 4.2 (*)    All other components within normal limits  COMPREHENSIVE METABOLIC PANEL - Abnormal; Notable for the following:    Glucose, Bld 107 (*)    Total Bilirubin 0.1 (*)    All other components within normal limits  URINALYSIS, ROUTINE W REFLEX MICROSCOPIC  CK  BASIC METABOLIC PANEL  CBC  POCT I-STAT TROPONIN I    EKG sinus tachycardia no acute ischemic changes no Q waves. Indication weakness Mr Lumbar Spine W Wo Contrast  10/03/2012   *RADIOLOGY REPORT*  Clinical Data: Back pain.  Right leg weakness.  MRI LUMBAR SPINE WITHOUT AND WITH CONTRAST  Technique:  Multiplanar and multiecho pulse sequences of the lumbar spine were obtained without and with intravenous contrast.  Contrast:  15 ml  Multihance.  Comparison: Abdominal radiograph 02/12/2012.  Findings: Mild dextroconvex curvature of the lumbar spine with the apex at L4-L5.  On prior abdominal radiograph, there appear to be five lumbar type vertebral bodies.  Study is mildly degraded by motion artifact on the axial image sets.  There is a cystic lesion in the anterior aspect of the left inferior renal pole, probably representing a cyst.  There is nonspecific edema and enhancement of the paraspinal muscles extending from L1-L5 on the  left and with a smaller extent on the right, centered around L3.  This is best appreciated on the coronal inversion recovery images (image number five series 7).  There is no nonenhancing fluid to suggest abscess. Lumbar degenerative disease is relatively minor.  The marrow signal is within normal limits.  Central canal and foramina appear patent from T11-T12 through L3-L4.  L4-L5:  Shallow right eccentric disc bulging.  Bulging disc just contacts the descending right L5 nerve as it approaches the lateral recess.  Central canal appears patent.  There is no neural compression.  There is a small left foraminal protrusion that could irritate the exiting left L4 nerve in the foramen.  Left foraminal stenosis is minimal.  L5-S1:  Disc desiccation is present.  Small central and right paracentral disc protrusion is present with annular tear.  This potentially irritates both descending S1 nerves in the lateral recesses without neural compression.  The foramina are widely patent.  Annular tear shows expected enhancement after Gadolinium administration.  IMPRESSION: 1.  Mild lumbar spondylosis, detailed above. 2.  Nonspecific myositis of the paraspinal musculature, greater on the left than right.  Potentially this represents rhabdomyolysis which is documented in the recent Hospital discharge.  Strain injury, pyomyositis, denervation, connective tissue myositis are in the differential considerations but unlikely.  Correlation  with CPK is recommended.   Original Report Authenticated By: Andreas Newport, M.D.   1. Myositis     MDM  Convoluted picture. He is a myositis per his MRI appears muscle enzymes are not elevated. The remainder of his transaminases and troponin have normalized. His EKG does not show any acute injuries. He has significant pain and will require admission for pain control and physical therapy. Diagnosis is myositis after recent episode rhabdomyolysis. His CPK is normal  Claudean Kinds, MD 10/03/12 2034  Claudean Kinds, MD 10/03/12 [phone]

## 2012-10-03 NOTE — H&P (Signed)
[facility] History and Physical  Bradley Day WGN:[id number] DOB: 1970/03/19 DOA: 10/03/2012  Referring physician: EDP PCP: No primary provider on file.  Specialists:   Chief Complaint: Increased Back pain and Difficulty Walking  HPI: Bradley Day is a 42 y.o. male who was discharged 5 days ago with Rhabdomyolysis who returns to the ED with complaints of difficulty walking an bearing weight on his left leg with pain going up into his left flank, and down his left leg.   He reports worsening over the past 5 days, and having a loss of appetite, and having chills and palpitations.  He reports not being able to get his pain medications and has been off of them since discharge.  He was evaluated in the ED ad an MRI of the Lumbar Spine was performed which revealed Myositis of the Left  Iliopsoas and chronic changes consistent with DDD.   He was referred for admission.      Review of Systems: The patient denies anorexia, fever, chills, headaches, weight loss, vision loss, diplopia, dizziness, decreased hearing, rhinitis, hoarseness, chest pain, syncope, dyspnea on exertion, peripheral edema, balance deficits, cough, hemoptysis, abdominal pain, nausea, vomiting, diarrhea, constipation, hematemesis, melena, hematochezia, severe indigestion/heartburn, dysuria, hematuria, incontinence, muscle weakness, suspicious skin lesions, transient blindness, difficulty walking, depression, unusual weight change, abnormal bleeding, enlarged lymph nodes, angioedema, and breast masses.    Past Medical History  Diagnosis Date  . Emphysema   . Depression   . Bipolar 1 disorder   . Pulmonary embolism August 2013    off anticoagulation 12/2011 per pt  . IV drug user     last IV drug use in 2011    Past Surgical History  Procedure Laterality Date  . Hand surgery      Prior to Admission medications   Medication Sig Start Date End Date Taking? Authorizing Provider  acetaminophen (TYLENOL) 325 MG tablet  Take 650 mg by mouth every 6 (six) hours as needed for pain.   Yes Historical Provider, MD  albuterol (PROVENTIL HFA;VENTOLIN HFA) 108 (90 BASE) MCG/ACT inhaler Inhale 2 puffs into the lungs every 6 (six) hours as needed for wheezing or shortness of breath.   Yes Historical Provider, MD  ALPRAZolam Prudy Feeler) 0.5 MG tablet Take 0.5 mg by mouth 3 (three) times daily.   Yes Historical Provider, MD  aspirin 81 MG tablet Take 81 mg by mouth daily.   Yes Historical Provider, MD  gabapentin (NEURONTIN) 600 MG tablet Take 600 mg by mouth 4 (four) times daily.   Yes Historical Provider, MD  omeprazole (PRILOSEC) 20 MG capsule Take 20 mg by mouth daily.   Yes Historical Provider, MD  oxycodone (ROXICODONE) 30 MG immediate release tablet Take 30 mg by mouth 6 (six) times daily.   Yes Historical Provider, MD  tiotropium (SPIRIVA) 18 MCG inhalation capsule Place 18 mcg into inhaler and inhale daily.   Yes Historical Provider, MD  lidocaine (XYLOCAINE) 2 % solution Take 15 mLs by mouth as needed for pain. 09/26/12   Clydia Llano, MD  predniSONE (STERAPRED UNI-PAK) 10 MG tablet Take 6-5-4-3-2-1 tab PO daily till gone 09/26/12   Clydia Llano, MD    Allergies  Allergen Reactions  . Augmentin [Amoxicillin-Pot Clavulanate]     unknown  . Toradol [Ketorolac Tromethamine]     Severe headaches     Social History:  reports that he has been smoking Cigarettes.  He has been smoking about 1.00 pack per day. He does not have any smokeless  tobacco history on file. He reports that he uses illicit drugs (Cocaine, Marijuana, and IV). His alcohol history is not on file.     Family History  Problem Relation Age of Onset  . CAD Father   . CAD Maternal Grandmother   . Hypertension Maternal Grandfather   . Diabetes Mother   . Diabetes Maternal Grandfather   . Cancer - Prostate Maternal Grandfather       Physical Exam:  GEN:  Pleasant  Thin well developed  42 y.o. Caucasian male  examined  and in no acute distress;  cooperative with exam Filed Vitals:   10/03/12 1615 10/03/12 1715 10/03/12 1845 10/03/12 1930  BP: 108/84 109/75 101/60 96/53  Pulse: 88 91 86 78  Temp:      TempSrc:      Resp: 15 17    SpO2: 99% 98% 96% 98%   Blood pressure 96/53, pulse 78, temperature 98.3 F (36.8 C), temperature source Oral, resp. rate 17, SpO2 98.00%. PSYCH: He is alert and oriented x4; does not appear anxious does not appear depressed; affect is normal HEENT: Normocephalic and Atraumatic, Mucous membranes pink; PERRLA; EOM intact; Fundi:  Benign;  No scleral icterus, Nares: Patent, Oropharynx: Clear, Fair Dentition, Neck:  FROM, no cervical lymphadenopathy nor thyromegaly or carotid bruit; no JVD; Breasts:: Not examined CHEST WALL: No tenderness CHEST: Normal respiration, clear to auscultation bilaterally HEART: Regular rate and rhythm; no murmurs rubs or gallops BACK: No kyphosis or scoliosis; no CVA tenderness ABDOMEN: Positive Bowel Sounds, Scaphoid, soft non-tender; no masses, no organomegaly. 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 Genitalia: not examined PULSES: 2+ and symmetric SKIN: Normal hydration no rash or ulceration CNS: Cranial nerves 2-12 grossly intact no focal neurologic deficit    Labs on Admission:  Basic Metabolic Panel:  Recent Labs Lab 10/03/12 1907  NA 137  K 4.2  CL 100  CO2 27  GLUCOSE 107*  BUN 18  CREATININE 0.75  CALCIUM 9.3   Liver Function Tests:  Recent Labs Lab 10/03/12 1907  AST 14  ALT 23  ALKPHOS 81  BILITOT 0.1*  PROT 7.2  ALBUMIN 3.5   No results found for this basename: LIPASE, AMYLASE,  in the last 168 hours No results found for this basename: AMMONIA,  in the last 168 hours CBC:  Recent Labs Lab 10/03/12 1907  WBC 11.0*  NEUTROABS 5.7   HGB 13.8  HCT 40.4  MCV 87.1  PLT 431*   Cardiac Enzymes:  Recent Labs Lab 10/03/12 1907  CKTOTAL 33    BNP (last 3 results)  Recent Labs  09/21/12 2124  PROBNP 3389.0*   CBG: No results found for this basename: GLUCAP,  in the last 168 hours  Radiological Exams on Admission: Mr Lumbar Spine W Wo Contrast  10/03/2012   *RADIOLOGY REPORT*  Clinical Data: Back pain.  Right leg weakness.  MRI LUMBAR SPINE WITHOUT AND WITH CONTRAST  Technique:  Multiplanar and multiecho pulse sequences of the lumbar spine were obtained without and with intravenous contrast.  Contrast:  15 ml Multihance.  Comparison: Abdominal radiograph 02/12/2012.  Findings: Mild dextroconvex curvature of the lumbar spine with the apex at L4-L5.  On prior abdominal radiograph, there appear to be five lumbar type vertebral bodies.  Study is mildly degraded by motion artifact on the  axial image sets.  There is a cystic lesion in the anterior aspect of the left inferior renal pole, probably representing a cyst.  There is nonspecific edema and enhancement of the paraspinal muscles extending from L1-L5 on the left and with a smaller extent on the right, centered around L3.  This is best appreciated on the coronal inversion recovery images (image number five series 7).  There is no nonenhancing fluid to suggest abscess. Lumbar degenerative disease is relatively minor.  The marrow signal is within normal limits.  Central canal and foramina appear patent from T11-T12 through L3-L4.  L4-L5:  Shallow right eccentric disc bulging.  Bulging disc just contacts the descending right L5 nerve as it approaches the lateral recess.  Central canal appears patent.  There is no neural compression.  There is a small left foraminal protrusion that could irritate the exiting left L4 nerve in the foramen.  Left foraminal stenosis is minimal.  L5-S1:  Disc desiccation is present.  Small central and right paracentral disc protrusion is present with  annular tear.  This potentially irritates both descending S1 nerves in the lateral recesses without neural compression.  The foramina are widely patent.  Annular tear shows expected enhancement after Gadolinium administration.  IMPRESSION: 1.  Mild lumbar spondylosis, detailed above. 2.  Nonspecific myositis of the paraspinal musculature, greater on the left than right.  Potentially this represents rhabdomyolysis which is documented in the recent Hospital discharge.  Strain injury, pyomyositis, denervation, connective tissue myositis are in the differential considerations but unlikely.  Correlation with CPK is recommended.   Original Report Authenticated By: Andreas Newport, M.D.        Assessment/Plan Principal Problem:   Myositis Active Problems:   Opioid withdrawal   Diabetes mellitus   Chronic back pain   Nonischemic cardiomyopathy   Weakness of left leg    1.  Myositis- Check CPK Levels, Pain control PRN.     2.  Opioid Withdrawal- IV dilaudid PRN Pain, Resume Oxycodone 30 mg PO q 6 hours PRN.    3.  Chronic Back Pain- Same plan as #2.    4.  Diabetes Mellitus - No  Medications listed,  Place in SSI coverage , check HbA1C.    5.  Non-Ischemic Cardiomyopathy- Monitor on Telemetry for Changes.     6.  Weakness in Left Leg- acute and Chronic-  Due to #1, and #3 (DDD).   Physical therapy evaluation.     7.  Tobacco Abuse- Nicotine Patch daily.  Smoking Cessation discussed.    8.  DVT Prophylaxis with Lovenox.       Code Status:     FULL CODE  Family Communication:   Mother at Bedside Disposition Plan:     Return to Home  Time spent: 72 Minutes   Ron Parker [facility] Pager [phone]  If 7PM-7AM, please contact night-coverage [email or web address] Password [facility] 10/03/2012, 9:11 PM

## 2012-10-03 NOTE — Progress Notes (Signed)
Pt did not want to take Spiriva at this time. Pt wanted to eat.

## 2012-10-03 NOTE — ED Notes (Addendum)
Phlebotomy re paged for second attempt at blood draw. MD Fayrene Fearing made aware.

## 2012-10-03 NOTE — ED Notes (Signed)
Attempted to call report x2

## 2012-10-04 LAB — CBC
HCT: 37.8 % — ABNORMAL LOW (ref 39.0–52.0)
MCV: 86.9 fL (ref 78.0–100.0)
Platelets: 426 10*3/uL — ABNORMAL HIGH (ref 150–400)
RBC: 4.35 MIL/uL (ref 4.22–5.81)
RDW: 12.6 % (ref 11.5–15.5)
WBC: 9.1 10*3/uL (ref 4.0–10.5)

## 2012-10-04 LAB — RAPID URINE DRUG SCREEN, HOSP PERFORMED
Amphetamines: NOT DETECTED
Barbiturates: NOT DETECTED
Opiates: POSITIVE — AB
Tetrahydrocannabinol: POSITIVE — AB

## 2012-10-04 LAB — GLUCOSE, CAPILLARY: Glucose-Capillary: 108 mg/dL — ABNORMAL HIGH (ref 70–99)

## 2012-10-04 LAB — HEMOGLOBIN A1C: Hgb A1c MFr Bld: 5.8 % — ABNORMAL HIGH (ref <5.7)

## 2012-10-04 LAB — BASIC METABOLIC PANEL
CO2: 25 mEq/L (ref 19–32)
Chloride: 101 mEq/L (ref 96–112)
Creatinine, Ser: 0.76 mg/dL (ref 0.50–1.35)
GFR calc Af Amer: >90 mL/min (ref >90)
Potassium: 3.9 mEq/L (ref 3.5–5.1)
Sodium: 137 mEq/L (ref 135–145)

## 2012-10-04 MED ORDER — ENSURE COMPLETE PO LIQD
237.0000 mL | Freq: Two times a day (BID) | ORAL | Status: DC
  Administered 2012-10-04 – 2012-10-06 (×4): 237 mL via ORAL

## 2012-10-04 MED ORDER — POLYETHYLENE GLYCOL 3350 17 G PO PACK
17.0000 g | PACK | Freq: Every day | ORAL | Status: DC
  Administered 2012-10-04 – 2012-10-05 (×2): 17 g via ORAL
  Filled 2012-10-04 (×3): qty 1

## 2012-10-04 MED ORDER — DOCUSATE SODIUM 100 MG PO CAPS
100.0000 mg | ORAL_CAPSULE | Freq: Two times a day (BID) | ORAL | Status: DC
  Administered 2012-10-04 – 2012-10-05 (×4): 100 mg via ORAL
  Filled 2012-10-04 (×6): qty 1

## 2012-10-04 MED ORDER — NICOTINE 21 MG/24HR TD PT24: 21.0000 mg | MEDICATED_PATCH | Freq: Every day | TRANSDERMAL | Status: DC

## 2012-10-04 MED ORDER — GABAPENTIN 400 MG PO CAPS
800.0000 mg | ORAL_CAPSULE | Freq: Three times a day (TID) | ORAL | Status: DC
  Administered 2012-10-04 – 2012-10-06 (×7): 800 mg via ORAL
  Filled 2012-10-04 (×8): qty 2

## 2012-10-04 MED ORDER — GABAPENTIN 800 MG PO TABS: 800.0000 mg | ORAL_TABLET | Freq: Three times a day (TID) | ORAL | Status: DC

## 2012-10-04 MED ORDER — NICOTINE 21 MG/24HR TD PT24
21.0000 mg | MEDICATED_PATCH | Freq: Every day | TRANSDERMAL | Status: DC
  Administered 2012-10-04 – 2012-10-06 (×3): 21 mg via TRANSDERMAL
  Filled 2012-10-04 (×3): qty 1

## 2012-10-04 MED ORDER — OXYCODONE HCL 5 MG PO TABS: 15.0000 mg | ORAL_TABLET | Freq: Every day | ORAL | Status: DC

## 2012-10-04 MED ORDER — OXYCODONE HCL 5 MG PO TABS
15.0000 mg | ORAL_TABLET | ORAL | Status: DC | PRN
  Administered 2012-10-04 – 2012-10-05 (×5): 15 mg via ORAL
  Filled 2012-10-04 (×5): qty 3

## 2012-10-04 MED ORDER — SENNA 8.6 MG PO TABS
2.0000 | ORAL_TABLET | Freq: Every day | ORAL | Status: DC
  Administered 2012-10-04 – 2012-10-05 (×2): 17.2 mg via ORAL
  Filled 2012-10-04 (×3): qty 2

## 2012-10-04 MED ORDER — BISACODYL 10 MG RE SUPP: 10.0000 mg | Freq: Every day | RECTAL | Status: DC | PRN

## 2012-10-04 MED ORDER — OXYCODONE HCL ER 10 MG PO T12A
20.0000 mg | EXTENDED_RELEASE_TABLET | Freq: Two times a day (BID) | ORAL | Status: DC
  Administered 2012-10-04 – 2012-10-05 (×3): 20 mg via ORAL
  Filled 2012-10-04 (×3): qty 2

## 2012-10-04 MED ORDER — SODIUM CHLORIDE 0.9 % IV BOLUS (SEPSIS)
1000.0000 mL | Freq: Once | INTRAVENOUS | Status: AC
  Administered 2012-10-04: 1000 mL via INTRAVENOUS

## 2012-10-04 NOTE — Evaluation (Signed)
Physical Therapy Evaluation Patient Details Name: Bradley Day MRN: [id number] DOB: [birth year] Today's Date: 10/04/2012 Time: 1308-6578 PT Time Calculation (min): 22 min  PT Assessment / Plan / Recommendation History of Present Illness  42 y.o. male who was discharged 5 days ago with Rhabdomyolysis who returns to the ED with complaints of difficulty walking an bearing weight on his left leg with pain going up into his left flank, and down his left leg.   He reports worsening over the past 5 days, and having a loss of appetite, and having chills and palpitations.  He reports not being able to get his pain medications and has been off of them since discharge.  He was evaluated in the ED ad an MRI of the Lumbar Spine was performed which revealed Myositis of the Left  Iliopsoas and chronic changes consistent with DDD.   He was referred for admission  Clinical Impression  Pt presenting with altered gait pattern benefiting from use of RW to assist with L LE pain management. Acute PT to follow to progress ambulation tolerance.    PT Assessment  Patient needs continued PT services    Follow Up Recommendations  No PT follow up;Supervision - Intermittent    Does the patient have the potential to tolerate intense rehabilitation      Barriers to Discharge        Equipment Recommendations  Rolling walker with 5" wheels    Recommendations for Other Services     Frequency Min 3X/week    Precautions / Restrictions Precautions Precautions: Fall Restrictions Weight Bearing Restrictions: No   Pertinent Vitals/Pain 5/10 L flank/back pain      Mobility  Bed Mobility Bed Mobility: Supine to Sit;Sitting - Scoot to Edge of Bed;Sit to Supine Supine to Sit: 6: Modified independent (Device/Increase time);With rails;HOB elevated Sitting - Scoot to Edge of Bed: 6: Modified independent (Device/Increase time);With rail Sit to Supine: 6: Modified independent (Device/Increase time);With rail;HOB  elevated Details for Bed Mobility Assistance: used railing to get to EOB, but did so quickly and easily Transfers Transfers: Sit to Stand;Stand to Sit Sit to Stand: 5: Supervision;With upper extremity assist;From bed Stand to Sit: 5: Supervision;With upper extremity assist;To bed Details for Transfer Assistance: supervision for safety Ambulation/Gait Ambulation/Gait Assistance: 4: Min guard Ambulation Distance (Feet): 150 Feet Assistive device: Rolling walker Ambulation/Gait Assistance Details: pt supervision with RW however min guard without RW due to increased instability and antalgic limp Gait Pattern: Step-through pattern;Antalgic Gait velocity: slow Stairs: No    Exercises     PT Diagnosis: Difficulty walking  PT Problem List: Decreased strength;Decreased activity tolerance;Decreased balance;Decreased mobility PT Treatment Interventions: DME instruction;Gait training;Functional mobility training;Therapeutic activities;Therapeutic exercise     PT Goals(Current goals can be found in the care plan section) Acute Rehab PT Goals Patient Stated Goal: for my back and leg to get better PT Goal Formulation: With patient Potential to Achieve Goals: Good  Visit Information  Last PT Received On: 10/04/12 Assistance Needed: +1 History of Present Illness: 42 y.o. male who was discharged 5 days ago with Rhabdomyolysis who returns to the ED with complaints of difficulty walking an bearing weight on his left leg with pain going up into his left flank, and down his left leg.   He reports worsening over the past 5 days, and having a loss of appetite, and having chills and palpitations.  He reports not being able to get his pain medications and has been off of them since discharge.  He was  evaluated in the ED ad an MRI of the Lumbar Spine was performed which revealed Myositis of the Left  Iliopsoas and chronic changes consistent with DDD.   He was referred for admission       Prior Functioning   Home Living Family/patient expects to be discharged to:: Private residence Living Arrangements: Alone Available Help at Discharge: Family;Available PRN/intermittently Type of Home: Apartment Home Access: Level entry Home Layout: One level Home Equipment: None Prior Function Level of Independence: Independent Communication Communication:  (soft spoken, raspy voice)    Cognition  Cognition Arousal/Alertness: Awake/alert Behavior During Therapy: WFL for tasks assessed/performed Overall Cognitive Status: Within Functional Limits for tasks assessed    Extremity/Trunk Assessment Upper Extremity Assessment Upper Extremity Assessment: Overall WFL for tasks assessed Lower Extremity Assessment Lower Extremity Assessment: LLE deficits/detail LLE Deficits / Details: hip flexion limited by onset of pain, knee/ankle wfl LLE Sensation:  (c/o numbness) Cervical / Trunk Assessment Cervical / Trunk Assessment: Normal   Balance    End of Session PT - End of Session Activity Tolerance: Patient tolerated treatment well Patient left: in bed;with call bell/phone within reach;with family/visitor present Nurse Communication: Mobility status  GP     Marcene Brawn 10/04/2012, 10:04 AM  Lewis Shock, PT, DPT Pager #: [phone] Office #: [phone]

## 2012-10-04 NOTE — Progress Notes (Signed)
INITIAL NUTRITION ASSESSMENT  DOCUMENTATION CODES Per approved criteria  -Severe malnutrition in the context of acute illness or injury   INTERVENTION:  Ensure Complete twice daily (350 kcals, 13 gm protein per 8 fl oz bottle) RD to follow for nutrition care plan  NUTRITION DIAGNOSIS: Unintended weight loss related to inadequate oral intake as evidenced by 13% weight loss  Goal: Pt to meet >/= 90% of their estimated nutrition needs   Monitor:  PO & supplemental intake, weight, labs, I/O's  Reason for Assessment: Malnutrition Screening Tool Report  42 y.o. male  Admitting Dx: Myositis  ASSESSMENT: Patient presented to ED with complaints of difficulty walking and bearing weight on his left leg with pain going up into his left flank; reported loss of appetite, chills and palpitations; MRI of lumbar spine revealed myositis of left Iliopsoas and chronic changes consistent with DDD.   Patient reports his appetite is back; PO intake 100% per flowsheet records; PTA patient was not eating much at all for ~ 9 days; he was drinking Ensure supplements during this time; reports severe weight loss; per records, he's had a 13% loss; he would like during his hospital stay ---> RD to order.  Patient meets criteria for severe malnutrition in the context of acute illness or injury as evidenced by < 50% intake of estimated energy requirement for > 5 days and 13% weight loss x 1 week.  Height: Ht Readings from Last 1 Encounters:  10/03/12 6\' 1"  (1.854 m)    Weight: Wt Readings from Last 1 Encounters:  10/03/12 156 lb 11.2 oz (71.079 kg)    Ideal Body Weight: 178 lb  % Ideal Body Weight: 87%  Wt Readings from Last 10 Encounters:  10/03/12 156 lb 11.2 oz (71.079 kg)  09/21/12 180 lb (81.647 kg)  02/12/12 170 lb (77.111 kg)    Usual Body Weight: 180 lb  % Usual Body Weight: 86%  BMI:  Body mass index is 20.68 kg/(m^2).  Estimated Nutritional Needs: Kcal: 2000-2200 Protein:  100-110 gm Fluid: 2.0-2.2 L  Skin: Intact  Diet Order: Dysphagia 3, nectar thick liquids  EDUCATION NEEDS: -No education needs identified at this time   Intake/Output Summary (Last 24 hours) at 10/04/12 1311 Last data filed at 10/04/12 0735  Gross per 24 hour  Intake    320 ml  Output      0 ml  Net    320 ml    Labs:   Recent Labs Lab 10/03/12 1907 10/04/12 0430  NA 137 137  K 4.2 3.9  CL 100 101  CO2 27 25  BUN 18 20  CREATININE 0.75 0.76  CALCIUM 9.3 8.8  GLUCOSE 107* 119*    CBG (last 3)   Recent Labs  10/04/12 0401 10/04/12 0759 10/04/12 1143  GLUCAP 112* 110* 108*    Scheduled Meds: . ALPRAZolam  0.5 mg Oral TID  . bacitracin  1 application Topical BID  . enoxaparin (LOVENOX) injection  40 mg Subcutaneous Q24H  . gabapentin  800 mg Oral TID  . insulin aspart  0-9 Units Subcutaneous Q4H  . nicotine  21 mg Transdermal Daily  . OxyCODONE  20 mg Oral Q12H  . pantoprazole  40 mg Oral Daily  . sodium chloride  1,000 mL Intravenous Once  . tiotropium  18 mcg Inhalation Daily    Continuous Infusions: . sodium chloride 1,000 mL (10/04/12 0731)    Past Medical History  Diagnosis Date  . Emphysema   . Depression   .  Bipolar 1 disorder   . Pulmonary embolism August 2013    off anticoagulation 12/2011 per pt  . IV drug user     last IV drug use in 2011    Past Surgical History  Procedure Laterality Date  . Hand surgery      Maureen Chatters, RD, LDN Pager #: [phone] After-Hours Pager #: [phone]

## 2012-10-04 NOTE — Progress Notes (Addendum)
[facility] PROGRESS NOTE  Bradley Day BJY:[id number] DOB: 1970/08/16 DOA: 10/03/2012 PCP: No primary provider on file.  Assessment/Plan  Healing rhabdomyolysis:  CPK now normal.  Increased pain may be secondary to narcotic withdrawal.  Worked with PT today who recommended no PT follow up and rolling walker. -  Continue robaxin  Opioid withdrawal:  Needs to be on long acting pain medication to prevent withdrawal.  Ideally he should be on a methadone program and slowly taper off.  We had a long conversation about narcotics and the dangers of mixing narcotics with each other and with benzodiazepines.  Patient and mother agreed to the following changes: -  Will start oxycontin 20mg  BID -  Wean to oxycodone IR 15mg  q4h prn -  No IV pain medications -  No benzodiazepines -  Will get RX for long acting narcotic at discharge, one month supply.  No refills -  He needs to find a new pain management clinic -  Add on urine drug screen to admission UA. -  Monitor for signs of withdrawal on current regimen -  Changed to home dose gabapentin 800mg  TID -  Start bowel regimen to prevent constipation  Dysphagia and risk of aspiration -  Patient refuses dysphagia 3 with nectar thick.  Aware of aspiration risk.  -  Speech eval to see if he can progress to more regular diet  -  Continue ensure  COPD, stable.  Continue albuterol prn with spiriva  Tobacco abuse:  Continue nicotine patch.  Counseled cessation  Hypotension, chronically low BPs normally, but may be dehydrated due to recent withdrawals -  IVF -  Bolus NS  Blood glucose in normal range -  D/c fingersticks and SSI  Severe protein calorie malnutrition.  Supplements, liberalized diet.  Diet:  Regular per patient Access:  PIV IVF:  NS  Proph:  lovenox  Code Status: full Family Communication: with patient and mother Disposition Plan: pending weaned to long acting narcotics only without significant withdrawal  symptoms  Consultants:  None  Procedures:  MRI lumbar spine  Antibiotics:  None   HPI/Subjective:  Continues to have severe pain in the left lower back, but able to ambulate with PT and rolling walker.  Eating better now.    Objective: Filed Vitals:   10/04/12 0000 10/04/12 0336 10/04/12 0758 10/04/12 1145  BP: 107/69 111/72 96/45 91/36   Pulse: 91 76 74 76  Temp: 97.8 F (36.6 C) 97.9 F (36.6 C) 97.4 F (36.3 C) 97.9 F (36.6 C)  TempSrc: Oral Axillary Oral Oral  Resp: 20 18 18 18   Height:      Weight:      SpO2: 97% 99% 97% 96%    Intake/Output Summary (Last 24 hours) at 10/04/12 1321 Last data filed at 10/04/12 0735  Gross per 24 hour  Intake    320 ml  Output      0 ml  Net    320 ml   Filed Weights   10/03/12 2214  Weight: 71.079 kg (156 lb 11.2 oz)    Exam:   General:  Thin CM, No acute distress  HEENT:  NCAT, MMM  Cardiovascular:  RRR, nl S1, S2 no mrg, 2+ pulses, warm extremities  Respiratory:  CTAB, no increased WOB  Abdomen:   NABS, soft, NT/ND  MSK:   Normal tone and bulk, no LEE, TTP over the left lumbar paraspinous muscles  Neuro:  Grossly intact  Data Reviewed: Basic Metabolic Panel:  Recent Labs Lab 10/03/12  1907 10/04/12 0430  NA 137 137  K 4.2 3.9  CL 100 101  CO2 27 25  GLUCOSE 107* 119*  BUN 18 20  CREATININE 0.75 0.76  CALCIUM 9.3 8.8   Liver Function Tests:  Recent Labs Lab 10/03/12 1907  AST 14  ALT 23  ALKPHOS 81  BILITOT 0.1*  PROT 7.2  ALBUMIN 3.5   No results found for this basename: LIPASE, AMYLASE,  in the last 168 hours No results found for this basename: AMMONIA,  in the last 168 hours CBC:  Recent Labs Lab 10/03/12 1907 10/04/12 0430  WBC 11.0* 9.1  NEUTROABS 5.7  --   HGB 13.8 13.4  HCT 40.4 37.8*  MCV 87.1 86.9  PLT 431* 426*   Cardiac Enzymes:  Recent Labs Lab 10/03/12 1907  CKTOTAL 33   BNP (last 3 results)  Recent Labs  09/21/12 2124  PROBNP 3389.0*    CBG:  Recent Labs Lab 10/03/12 2241 10/04/12 10/04/12 0401 10/04/12 0759 10/04/12 1143  GLUCAP 116* 143* 112* 110* 108*    No results found for this or any previous visit (from the past 240 hour(s)).   Studies: Mr Lumbar Spine W Wo Contrast  10/03/2012   *RADIOLOGY REPORT*  Clinical Data: Back pain.  Right leg weakness.  MRI LUMBAR SPINE WITHOUT AND WITH CONTRAST  Technique:  Multiplanar and multiecho pulse sequences of the lumbar spine were obtained without and with intravenous contrast.  Contrast:  15 ml Multihance.  Comparison: Abdominal radiograph 02/12/2012.  Findings: Mild dextroconvex curvature of the lumbar spine with the apex at L4-L5.  On prior abdominal radiograph, there appear to be five lumbar type vertebral bodies.  Study is mildly degraded by motion artifact on the axial image sets.  There is a cystic lesion in the anterior aspect of the left inferior renal pole, probably representing a cyst.  There is nonspecific edema and enhancement of the paraspinal muscles extending from L1-L5 on the left and with a smaller extent on the right, centered around L3.  This is best appreciated on the coronal inversion recovery images (image number five series 7).  There is no nonenhancing fluid to suggest abscess. Lumbar degenerative disease is relatively minor.  The marrow signal is within normal limits.  Central canal and foramina appear patent from T11-T12 through L3-L4.  L4-L5:  Shallow right eccentric disc bulging.  Bulging disc just contacts the descending right L5 nerve as it approaches the lateral recess.  Central canal appears patent.  There is no neural compression.  There is a small left foraminal protrusion that could irritate the exiting left L4 nerve in the foramen.  Left foraminal stenosis is minimal.  L5-S1:  Disc desiccation is present.  Small central and right paracentral disc protrusion is present with annular tear.  This potentially irritates both descending S1 nerves in the  lateral recesses without neural compression.  The foramina are widely patent.  Annular tear shows expected enhancement after Gadolinium administration.  IMPRESSION: 1.  Mild lumbar spondylosis, detailed above. 2.  Nonspecific myositis of the paraspinal musculature, greater on the left than right.  Potentially this represents rhabdomyolysis which is documented in the recent Hospital discharge.  Strain injury, pyomyositis, denervation, connective tissue myositis are in the differential considerations but unlikely.  Correlation with CPK is recommended.   Original Report Authenticated By: Andreas Newport, M.D.    Scheduled Meds: . ALPRAZolam  0.5 mg Oral TID  . bacitracin  1 application Topical BID  . enoxaparin (LOVENOX) injection  40 mg Subcutaneous Q24H  . gabapentin  800 mg Oral TID  . insulin aspart  0-9 Units Subcutaneous Q4H  . nicotine  21 mg Transdermal Daily  . OxyCODONE  20 mg Oral Q12H  . pantoprazole  40 mg Oral Daily  . sodium chloride  1,000 mL Intravenous Once  . tiotropium  18 mcg Inhalation Daily   Continuous Infusions: . sodium chloride 1,000 mL (10/04/12 0731)    Principal Problem:   Myositis Active Problems:   Diabetes mellitus   Chronic back pain   Nonischemic cardiomyopathy   Opioid withdrawal   Weakness of left leg    Time spent: 30 min    Spencer Peterkin, [facility]  [facility] Pager [phone]. If 7PM-7AM, please contact night-coverage at [email or web address], password [facility] 10/04/2012, 1:21 PM  LOS: 1 day

## 2012-10-05 DIAGNOSIS — E43 Unspecified severe protein-calorie malnutrition: Secondary | ICD-10-CM | POA: Insufficient documentation

## 2012-10-05 DIAGNOSIS — M6282 Rhabdomyolysis: Secondary | ICD-10-CM

## 2012-10-05 MED ORDER — BISACODYL 5 MG PO TBEC
5.0000 mg | DELAYED_RELEASE_TABLET | Freq: Once | ORAL | Status: AC
  Administered 2012-10-05: 5 mg via ORAL
  Filled 2012-10-05: qty 1

## 2012-10-05 MED ORDER — DIPHENHYDRAMINE HCL 25 MG PO CAPS: 25.0000 mg | ORAL_CAPSULE | Freq: Once | ORAL | Status: DC

## 2012-10-05 MED ORDER — OXYCODONE HCL 5 MG PO TABS
15.0000 mg | ORAL_TABLET | Freq: Four times a day (QID) | ORAL | Status: DC | PRN
  Administered 2012-10-05 – 2012-10-06 (×2): 15 mg via ORAL
  Filled 2012-10-05 (×2): qty 3

## 2012-10-05 MED ORDER — OXYCODONE HCL ER 10 MG PO T12A
30.0000 mg | EXTENDED_RELEASE_TABLET | Freq: Two times a day (BID) | ORAL | Status: DC
  Administered 2012-10-05 – 2012-10-06 (×2): 30 mg via ORAL
  Filled 2012-10-05 (×2): qty 3

## 2012-10-05 NOTE — Evaluation (Signed)
Clinical/Bedside Swallow Evaluation Patient Details  Name: Bradley Day MRN: [id number] Date of Birth: 10/30/1970  Today's Date: 10/05/2012 Time: 1500-1526 SLP Time Calculation (min): 26 min  Past Medical History:  Past Medical History  Diagnosis Date  . Emphysema   . Depression   . Bipolar 1 disorder   . Pulmonary embolism August 2013    off anticoagulation 12/2011 per pt  . IV drug user     last IV drug use in 2011   Past Surgical History:  Past Surgical History  Procedure Laterality Date  . Hand surgery     HPI:  42 year old male readmitted 10/03/12 due to difficulty walking and difficulty weight bearing on left leg.  MRI revealed myositis.  Pt continues hoarse following self-extubation earlier this month.  MBS completed during last admision.  BSE ordered for assessment of swallow function and safety.   Assessment / Plan / Recommendation Clinical Impression  Pt. continues to have hoarse/raspy voice.  Appears to tolerate thin liquids and solids, and no longer complains of severe throat pain with swallowing (and at rest). Given continued hoarseness, recommend consideration of ENT consult.    Aspiration Risk  Mild    Diet Recommendation Regular   Liquid Administration via: Cup Medication Administration: Whole meds with liquid Supervision: Intermittent supervision to cue for compensatory strategies Compensations: Slow rate;Small sips/bites Postural Changes and/or Swallow Maneuvers: Seated upright 90 degrees    Other  Recommendations Recommended Consults: Consider ENT evaluation Oral Care Recommendations: Oral care QID   Follow Up Recommendations  None    Frequency and Duration        Pertinent Vitals/Pain Back pain reported    Swallow Study Prior Functional Status   Pt tolerating regular diet, thin liquids at home with occasional choking. Pt admitted not maintaining upright position during po intake    General Date of Onset: 10/05/12 HPI: 42 year old male  readmitted 10/03/12 due to difficulty walking and difficulty weight bearing on left leg.  MRI revealed myositis.  Pt continues hoarse following self-extubation earlier this month.  MBS completed during last admision.  BSE ordered for assessment of swallow function and safety. Type of Study: Bedside swallow evaluation Previous Swallow Assessment: BSE and MBS 09/23/12 Diet Prior to this Study: Regular;Thin liquids Temperature Spikes Noted: No Respiratory Status: Room air History of Recent Intubation:  (1 day during last admit) Date extubated: 09/21/12 Behavior/Cognition: Alert;Cooperative;Pleasant mood Oral Cavity - Dentition: Adequate natural dentition;Missing dentition;Poor condition Patient Positioning: Upright in bed Baseline Vocal Quality: Hoarse Volitional Cough: Weak Volitional Swallow: Able to elicit    Oral/Motor/Sensory Function Overall Oral Motor/Sensory Function: Appears within functional limits for tasks assessed   Ice Chips Ice chips: Not tested   Thin Liquid Thin Liquid: Within functional limits Presentation: Cup    Nectar Thick Nectar Thick Liquid: Not tested   Honey Thick Honey Thick Liquid: Not tested   Puree Puree: Not tested   Solid   GO   Lyrah Bradt B. Murvin Natal [facility], CCC-SLP [phone] Solid: Within functional limits Presentation: Self Fed       Leigh Aurora 10/05/2012,3:33 PM

## 2012-10-05 NOTE — Progress Notes (Signed)
[facility] PROGRESS NOTE  Bradley ALLENDER ZOX:[id number] DOB: 06-23-1970 DOA: 10/03/2012 PCP: No primary provider on file.  Assessment/Plan  Healing rhabdomyolysis:  CPK now normal.  Increased pain may be secondary to narcotic withdrawal.  Worked with PT today who recommended no PT follow up and rolling walker. -  Continue robaxin  Opioid withdrawal:  Needs to be on long acting pain medication to prevent withdrawal.  Ideally he should be on a suboxone program and slowly taper off.  We had a long conversation about narcotics and the dangers of mixing narcotics with each other and with benzodiazepines.  Had some nausea and increased pain which prevented him from eating.  Patient and mother agreed to the following changes: -  Increase to oxycontin 30mg  BID -  Wean to oxycodone IR 15mg  q6h prn -  No IV pain medications -  No benzodiazepines -  Will get RX for long acting narcotic at discharge, one month supply.  No refills -  He needs to find a new pain management clinic -  UDS positive for THC. -  Monitor for signs of withdrawal on current regimen -  Changed to home dose gabapentin 800mg  TID -  Continue bowel regimen to prevent constipation and add bisacodyl once  Dysphagia and risk of aspiration.  Cleared by speech to regular diet.  Continue ensure  COPD, stable.  Continue albuterol prn with spiriva  Tobacco abuse:  Continue nicotine patch.  Counseled cessation  Hypotension, chronically low BPs normally, still low -  Continue IVF  Severe protein calorie malnutrition.  Supplements, liberalized diet.  Diet:  Regular with supplements Access:  PIV IVF:  NS  Proph:  lovenox  Code Status: full Family Communication: with patient and mother Disposition Plan: home tomorrow.    Consultants:  None  Procedures:  MRI lumbar spine  Antibiotics:  None   HPI/Subjective:  Continues to have severe pain in the left lower back, but able to ambulate with PT and rolling walker.   Eating better but now having worsening nausea and couldn't eat lunch.    Objective: Filed Vitals:   10/05/12 0400 10/05/12 0800 10/05/12 0833 10/05/12 1252  BP: 91/49 106/61  105/60  Pulse: 79 78  86  Temp: 97.8 F (36.6 C) 97.5 F (36.4 C)  98.1 F (36.7 C)  TempSrc:  Oral  Oral  Resp: 18 18  18   Height:      Weight:      SpO2: 98% 100% 100% 98%    Intake/Output Summary (Last 24 hours) at 10/05/12 1642 Last data filed at 10/05/12 1100  Gross per 24 hour  Intake   2700 ml  Output      0 ml  Net   2700 ml   Filed Weights   10/03/12 2214  Weight: 71.079 kg (156 lb 11.2 oz)    Exam:   General:  Thin CM, No acute distress, stable from prior  HEENT:  NCAT, MMM  Cardiovascular:  RRR, nl S1, S2 no mrg, 2+ pulses, warm extremities  Respiratory:  CTAB, no increased WOB  Abdomen:   NABS, soft, NT/ND  MSK:   Normal tone and bulk, no LEE, TTP over the left lumbar paraspinous muscles  Neuro:  Grossly intact  Data Reviewed: Basic Metabolic Panel:  Recent Labs Lab 10/03/12 1907 10/04/12 0430  NA 137 137  K 4.2 3.9  CL 100 101  CO2 27 25  GLUCOSE 107* 119*  BUN 18 20  CREATININE 0.75 0.76  CALCIUM 9.3  8.8   Liver Function Tests:  Recent Labs Lab 10/03/12 1907  AST 14  ALT 23  ALKPHOS 81  BILITOT 0.1*  PROT 7.2  ALBUMIN 3.5   No results found for this basename: LIPASE, AMYLASE,  in the last 168 hours No results found for this basename: AMMONIA,  in the last 168 hours CBC:  Recent Labs Lab 10/03/12 1907 10/04/12 0430  WBC 11.0* 9.1  NEUTROABS 5.7  --   HGB 13.8 13.4  HCT 40.4 37.8*  MCV 87.1 86.9  PLT 431* 426*   Cardiac Enzymes:  Recent Labs Lab 10/03/12 1907  CKTOTAL 33   BNP (last 3 results)  Recent Labs  09/21/12 2124  PROBNP 3389.0*   CBG:  Recent Labs Lab 10/04/12 10/04/12 0401 10/04/12 0759 10/04/12 1143 10/04/12 1608  GLUCAP 143* 112* 110* 108* 111*    No results found for this or any previous visit (from the  past 240 hour(s)).   Studies: Mr Lumbar Spine W Wo Contrast  10/03/2012   *RADIOLOGY REPORT*  Clinical Data: Back pain.  Right leg weakness.  MRI LUMBAR SPINE WITHOUT AND WITH CONTRAST  Technique:  Multiplanar and multiecho pulse sequences of the lumbar spine were obtained without and with intravenous contrast.  Contrast:  15 ml Multihance.  Comparison: Abdominal radiograph 02/12/2012.  Findings: Mild dextroconvex curvature of the lumbar spine with the apex at L4-L5.  On prior abdominal radiograph, there appear to be five lumbar type vertebral bodies.  Study is mildly degraded by motion artifact on the axial image sets.  There is a cystic lesion in the anterior aspect of the left inferior renal pole, probably representing a cyst.  There is nonspecific edema and enhancement of the paraspinal muscles extending from L1-L5 on the left and with a smaller extent on the right, centered around L3.  This is best appreciated on the coronal inversion recovery images (image number five series 7).  There is no nonenhancing fluid to suggest abscess. Lumbar degenerative disease is relatively minor.  The marrow signal is within normal limits.  Central canal and foramina appear patent from T11-T12 through L3-L4.  L4-L5:  Shallow right eccentric disc bulging.  Bulging disc just contacts the descending right L5 nerve as it approaches the lateral recess.  Central canal appears patent.  There is no neural compression.  There is a small left foraminal protrusion that could irritate the exiting left L4 nerve in the foramen.  Left foraminal stenosis is minimal.  L5-S1:  Disc desiccation is present.  Small central and right paracentral disc protrusion is present with annular tear.  This potentially irritates both descending S1 nerves in the lateral recesses without neural compression.  The foramina are widely patent.  Annular tear shows expected enhancement after Gadolinium administration.  IMPRESSION: 1.  Mild lumbar spondylosis,  detailed above. 2.  Nonspecific myositis of the paraspinal musculature, greater on the left than right.  Potentially this represents rhabdomyolysis which is documented in the recent Hospital discharge.  Strain injury, pyomyositis, denervation, connective tissue myositis are in the differential considerations but unlikely.  Correlation with CPK is recommended.   Original Report Authenticated By: Andreas Newport, M.D.    Scheduled Meds: . bacitracin  1 application Topical BID  . docusate sodium  100 mg Oral BID  . enoxaparin (LOVENOX) injection  40 mg Subcutaneous Q24H  . feeding supplement  237 mL Oral BID BM  . gabapentin  800 mg Oral TID  . nicotine  21 mg Transdermal Daily  .  OxyCODONE  30 mg Oral Q12H  . pantoprazole  40 mg Oral Daily  . polyethylene glycol  17 g Oral Daily  . senna  2 tablet Oral QHS  . tiotropium  18 mcg Inhalation Daily   Continuous Infusions: . sodium chloride 100 mL/hr at 10/05/12 1519    Principal Problem:   Myositis Active Problems:   Diabetes mellitus   Chronic back pain   Nonischemic cardiomyopathy   Opioid withdrawal   Weakness of left leg   Protein-calorie malnutrition, severe    Time spent: 30 min    Mana Morison, [facility]  [facility] Pager [phone]. If 7PM-7AM, please contact night-coverage at [email or web address], password [facility] 10/05/2012, 4:42 PM  LOS: 2 days

## 2012-10-06 LAB — GLUCOSE, CAPILLARY: Glucose-Capillary: 104 mg/dL — ABNORMAL HIGH (ref 70–99)

## 2012-10-06 MED ORDER — OXYCODONE HCL ER 15 MG PO T12A: 30.0000 mg | EXTENDED_RELEASE_TABLET | Freq: Two times a day (BID) | ORAL | Status: DC

## 2012-10-06 MED ORDER — MORPHINE SULFATE ER 30 MG PO TBCR: 30.0000 mg | EXTENDED_RELEASE_TABLET | Freq: Two times a day (BID) | ORAL | Status: DC

## 2012-10-06 MED ORDER — GABAPENTIN 400 MG PO CAPS: 800.0000 mg | ORAL_CAPSULE | Freq: Three times a day (TID) | ORAL | Status: DC

## 2012-10-06 MED ORDER — NICOTINE 21 MG/24HR TD PT24: 1.0000 | MEDICATED_PATCH | Freq: Every day | TRANSDERMAL | Status: DC

## 2012-10-06 NOTE — Progress Notes (Signed)
PT Cancellation Note  Patient Details Name: Bradley Day MRN: [id number] DOB: [birth year]   Cancelled Treatment:    Reason Eval/Treat Not Completed: Other (comment).  Pt due to d/c home shortly.  RW delivered and in room. Pt/mom don't have any questions of PT.     Rollene Rotunda Saunders [place], PT, DPT [phone]   10/06/2012, 2:57 PM

## 2012-10-06 NOTE — Care Management Note (Signed)
    Page 1 of 1   10/06/2012     10:39:02 AM   CARE MANAGEMENT NOTE 10/06/2012  Patient:  ABIR, CRAINE   Account Number:  [id number]  Date Initiated:  10/06/2012  Documentation initiated by:  GRAVES-BIGELOW,Macaela Presas  Subjective/Objective Assessment:   Pt admitted for Increased Back pain and Difficulty Walking.     Action/Plan:   Anticipated DC Date:  10/06/2012   Anticipated DC Plan:  HOME/SELF CARE      DC Planning Services  CM consult      PAC Choice  DURABLE MEDICAL EQUIPMENT   Choice offered to / List presented to:  C-1 Patient   DME arranged  Levan Hurst      DME agency  [facility]        Status of service:  Completed, signed off Medicare Important Message given?   (If response is "NO", the following Medicare IM given date fields will be blank) Date Medicare IM given:   Date Additional Medicare IM given:    Discharge Disposition:  HOME/SELF CARE  Per UR Regulation:  Reviewed for med. necessity/level of care/duration of stay  If discussed at Long Length of Stay Meetings, dates discussed:    Comments:  10-06-12 [address] [place], [place] [phone] Pt plan for d/c today and MD Short will make a pain clinic appointment for pt. Pt to be f/u here in [place]. CM did discuss pain clinics in [place] and he is refusing at this time. He wants to relocate and move here. No further needs from CM at this time.

## 2012-10-06 NOTE — Progress Notes (Signed)
CSW received referral regarding pt not having insurance. CSW called financial counselor to inform.  Raiford Fetterman, LCSWA [phone]

## 2012-10-06 NOTE — Discharge Summary (Addendum)
Physician Discharge Summary  Bradley Day ZOX:[id number] DOB: [birth year] DOA: 10/03/2012  PCP: No primary provider on file.  Admit date: 10/03/2012 Discharge date: 10/06/2012  Recommendations for Outpatient Follow-up:  1.  [facility] for outpatient detox from narcotics 2.   [facility] as needed if hoarseness does not improve.  Previously seen by Dr. Pollyann Kennedy 3.  Cardiology, Dr. Diona Foley, within 1 weeks.  Follow up NSTEMI type 2 and global hypokinesis.  Consider reinitiation of beta blocker if blood pressure tolerates.   Discharge Diagnoses:  Principal Problem:   Myositis Active Problems:   Diabetes mellitus   Chronic back pain   Nonischemic cardiomyopathy   Opioid withdrawal   Weakness of left leg   Protein-calorie malnutrition, severe   Discharge Condition: stable, improved  Diet recommendation: regular   Wt Readings from Last 3 Encounters:  10/03/12 71.079 kg (156 lb 11.2 oz)  09/21/12 81.647 kg (180 lb)  02/12/12 77.111 kg (170 lb)    History of present illness:  Bradley Day is a 42 y.o. male who was discharged 5 days ago with Rhabdomyolysis who returns to the ED with complaints of difficulty walking an bearing weight on his left leg with pain going up into his left flank, and down his left leg. He reports worsening over the past 5 days, and having a loss of appetite, and having chills and palpitations. He reports not being able to get his pain medications and has been off of them since discharge. He was evaluated in the ED ad an MRI of the Lumbar Spine was performed which revealed Myositis of the Left Iliopsoas and chronic changes consistent with DDD. He was referred for admission.    Hospital Course:   Healing rhabdomyolysis:  CPK now normal.  Increased pain may be secondary to narcotic withdrawal. Worked with PT who recommended no PT follow up and rolling walker.  Continued robaxin.  Opioid withdrawal:  Patient has history of IVDA,  reports quitting in 2011 and has been on prescription narcotics since that time via a pain clinic in [place].  He was offered narcotics at discharge from the hospital last week, however, he refused because he did not want to break his pain contract.  He was kicked out of his current pain clinic because he it is stated in the notes that he attempted to commit suicide by overdosing on his medications, which he and his mother adamantly deny.  He was started on oxcontin, the dose increased to a level where he was no longer having severe nausea and vomiting which may lead to dehydration.  His current dose is MS contin 30mg  BID.  He still has severe pain, however, he is able to eat, drink, ambulate to the bathroom and function at his current level.  He would benefit from a methadone or suboxone program.  He agrees to NO benzodiazepines and NO as needed pain medication.  He voiced understanding that my goal is not to treat his pain per se but to treat his symptoms of withdrawal.  Continued gabapentin for pain.  Added bowel regimen to prevent constipation.    Dysphagia and risk of aspiration. Cleared by speech to regular diet.  COPD, stable. Continue albuterol prn with spiriva  Tobacco abuse: Continue nicotine patch. Counseled cessation  Hypotension, chronically low BPs normally but also dehydrated at admission.  Improved with IVF. Severe protein calorie malnutrition. Supplements, liberalized diet.  NSTEMI with global hypokinesis after respiratory arrest on 8/2 due to drug overdose.  Patient states it was unintentional but that he had had new sedating medications added to his previous regimen.  Mother corroborates story.  [facility] where he presented states he may have intentionally overdosed.  His beta blocker has been discontinued because of persistent hypotension to the low 90s.  Pro-BNP is markedly improved.  Tolerated IVF without difficulty.     Consultants:  None Procedures:  MRI lumbar  spine Antibiotics:  None    Discharge Exam: Filed Vitals:   10/06/12 1629  BP: 137/85  Pulse: 89  Temp: 98.6 F (37 C)  Resp: 20   Filed Vitals:   10/06/12 0830 10/06/12 0855 10/06/12 1226 10/06/12 1629  BP: 120/73  109/74 137/85  Pulse: 92  79 89  Temp: 98.6 F (37 C)   98.6 F (37 C)  TempSrc: Oral  Oral Oral  Resp: 18  20 20   Height:      Weight:      SpO2: 95% 97% 98% 93%    General: Thin CM, No acute distress  HEENT: NCAT, MMM  Cardiovascular: RRR, nl S1, S2 no mrg, 2+ pulses, warm extremities  Respiratory: CTAB, no increased WOB  Abdomen: NABS, soft, NT/ND  MSK: Normal tone and bulk, no LEE, TTP over the left lumbar paraspinous muscles  Neuro: Grossly intact   Discharge Instructions      Discharge Orders   Future Orders Complete By Expires   Call MD for:  difficulty breathing, headache or visual disturbances  As directed    Call MD for:  extreme fatigue  As directed    Call MD for:  hives  As directed    Call MD for:  persistant dizziness or light-headedness  As directed    Call MD for:  persistant nausea and vomiting  As directed    Call MD for:  severe uncontrolled pain  As directed    Call MD for:  temperature >100.4  As directed    Diet general  As directed    Discharge instructions  As directed    Comments:     You were hospitalized with narcotic withdrawal and back pain from healing rhabdomyolysis which occurred during your illness during your last admission.  For your back pain, please stay hydrated, use heating packs or cool compresses as needed.  Please avoid ibuprofen (motrin), naprosyn (aleve) because you recently had some problems with your kidneys.  Please continue your gabapentin and I have given you a prescription for oxycontin to take twice a day to prevent narcotic withdrawal.  Please follow up with a pain clinic of your choice.  I have included the information for [facility] which is approximately $385 for a two week detox if you are  interested and they take walk-ins.  Please follow up with cardiology regarding your recent heart strain and with ENT if needed if your voice stays hoarse.  Please use colace and miralax every day and bisacodyl (dulcolax) as needed for constipation.  Bisacodyl is a stimulant laxative which is helpful if you take narcotics.   Increase activity slowly  As directed        Medication List    STOP taking these medications       ALPRAZolam 0.5 MG tablet  Commonly known as:  XANAX     gabapentin 600 MG tablet  Commonly known as:  NEURONTIN  Replaced by:  gabapentin 400 MG capsule     lidocaine 2 % solution  Commonly known as:  XYLOCAINE  oxycodone 30 MG immediate release tablet  Commonly known as:  ROXICODONE     predniSONE 10 MG tablet  Commonly known as:  STERAPRED UNI-PAK      TAKE these medications       acetaminophen 325 MG tablet  Commonly known as:  TYLENOL  Take 650 mg by mouth every 6 (six) hours as needed for pain.     albuterol 108 (90 BASE) MCG/ACT inhaler  Commonly known as:  PROVENTIL HFA;VENTOLIN HFA  Inhale 2 puffs into the lungs every 6 (six) hours as needed for wheezing or shortness of breath.     aspirin 81 MG tablet  Take 81 mg by mouth daily.     gabapentin 400 MG capsule  Commonly known as:  NEURONTIN  Take 2 capsules (800 mg total) by mouth 3 (three) times daily.     morphine 30 MG 12 hr tablet  Commonly known as:  MS CONTIN  Take 1 tablet (30 mg total) by mouth 2 (two) times daily.     nicotine 21 mg/24hr patch  Commonly known as:  NICODERM CQ - dosed in mg/24 hours  Place 1 patch onto the skin daily.     omeprazole 20 MG capsule  Commonly known as:  PRILOSEC  Take 20 mg by mouth daily.     tiotropium 18 MCG inhalation capsule  Commonly known as:  SPIRIVA  Place 18 mcg into inhaler and inhale daily.       Follow-up Information   Follow up with [facility]. Schedule an appointment as soon as possible for a visit in 2  weeks.   Contact information:   [address] [place] [address] [phone]      Schedule an appointment as soon as possible for a visit with [facility] .   Contact information:   [address] [place], [place]  [address] Ph:  [phone]       Follow up with Peter Swaziland, MD. Schedule an appointment as soon as possible for a visit in 1 week.   Specialty:  Cardiology   Contact information:   [address] [place] [address] [phone]       Follow up with [facility] Schedule an appointment as soon as possible for a visit in 1 month.   Contact information:   [address] [place] [address] [phone]      The results of significant diagnostics from this hospitalization (including imaging, microbiology, ancillary and laboratory) are listed below for reference.    Significant Diagnostic Studies: Mr Lumbar Spine W Wo Contrast  10/03/2012   *RADIOLOGY REPORT*  Clinical Data: Back pain.  Right leg weakness.  MRI LUMBAR SPINE WITHOUT AND WITH CONTRAST  Technique:  Multiplanar and multiecho pulse sequences of the lumbar spine were obtained without and with intravenous contrast.  Contrast:  15 ml Multihance.  Comparison: Abdominal radiograph 02/12/2012.  Findings: Mild dextroconvex curvature of the lumbar spine with the apex at L4-L5.  On prior abdominal radiograph, there appear to be five lumbar type vertebral bodies.  Study is mildly degraded by motion artifact on the axial image sets.  There is a cystic lesion in the anterior aspect of the left inferior renal pole, probably representing a cyst.  There is nonspecific edema and enhancement of the paraspinal muscles extending from L1-L5 on the left and with a smaller extent on the right, centered around L3.  This  is best appreciated on the coronal inversion recovery images (image number five series 7).  There is no nonenhancing fluid to suggest abscess.  Lumbar degenerative disease is relatively minor.  The marrow signal is within normal limits.  Central canal and foramina appear patent from T11-T12 through L3-L4.  L4-L5:  Shallow right eccentric disc bulging.  Bulging disc just contacts the descending right L5 nerve as it approaches the lateral recess.  Central canal appears patent.  There is no neural compression.  There is a small left foraminal protrusion that could irritate the exiting left L4 nerve in the foramen.  Left foraminal stenosis is minimal.  L5-S1:  Disc desiccation is present.  Small central and right paracentral disc protrusion is present with annular tear.  This potentially irritates both descending S1 nerves in the lateral recesses without neural compression.  The foramina are widely patent.  Annular tear shows expected enhancement after Gadolinium administration.  IMPRESSION: 1.  Mild lumbar spondylosis, detailed above. 2.  Nonspecific myositis of the paraspinal musculature, greater on the left than right.  Potentially this represents rhabdomyolysis which is documented in the recent Hospital discharge.  Strain injury, pyomyositis, denervation, connective tissue myositis are in the differential considerations but unlikely.  Correlation with CPK is recommended.   Original Report Authenticated By: Andreas Newport, M.D.   Nm Myocar Multi W/spect W/wall Motion / Ef  09/24/2012   *RADIOLOGY REPORT*  Clinical Data:  Chest pain  MYOCARDIAL IMAGING WITH SPECT (REST AND PHARMACOLOGIC-STRESS) GATED LEFT VENTRICULAR WALL MOTION STUDY LEFT VENTRICULAR EJECTION FRACTION  Technique:  Standard myocardial SPECT imaging was performed after resting intravenous injection of 10 mCi Tc-41m Sestamibi. Subsequently, intravenous infusion of regadenoson was performed under the supervision of the Cardiology staff.  At peak effect of the drug, 30 mCi Tc-31m Sestamibi was injected intravenously and standard myocardial SPECT  imaging was performed.  Quantitative gated  imaging was also performed to evaluate left ventricular wall motion, and estimate left ventricular ejection fraction.  Comparison:  None.  Findings: The Lexiscan stress ECG will be reported elsewhere in the chart.  Gated images showed EF 37% with global hypokinesis.  The rest perfusion study was count-poor.  The stress images showed a mild, small basal to mid inferior perfusion defect.  There was significant subdiaphragmatic radiotracer uptake.  IMPRESSION: 1. EF 37% with global hypokinesis.  2. The rest study was count-poor.  There was a primarily fixed, small, mild basal to mid inferior perfusion defect.  This may have been due to attenuation artifact related to the subdiaphragmatic radiotracer uptake.  There is no ischemia.  3. Suspect nonischemic cardiomyopathy.  Overall intermediate risk study due to decreased EF but no ischemia noted.   Original Report Authenticated By: Marca Ancona   Dg Chest Port 1 View  09/21/2012   *RADIOLOGY REPORT*  Clinical Data: Hypoxia, COPD  PORTABLE CHEST - 1 VIEW  Comparison: 02/12/2012  Findings: Diffuse interstitial edema pattern throughout the lungs compatible with CHF.  Bibasilar atelectasis also evident.  No large effusion or pneumothorax.  Trachea midline.  IMPRESSION: Mild diffuse interstitial edema and basilar atelectasis.   Original Report Authenticated By: Judie Petit. Miles Costain, M.D.    Microbiology: No results found for this or any previous visit (from the past 240 hour(s)).   Labs: Basic Metabolic Panel:  Recent Labs Lab 10/03/12 1907 10/04/12 0430  NA 137 137  K 4.2 3.9  CL 100 101  CO2 27 25  GLUCOSE 107* 119*  BUN 18 20  CREATININE 0.75 0.76  CALCIUM 9.3 8.8   Liver Function Tests:  Recent Labs Lab 10/03/12 1907  AST 14  ALT 23  ALKPHOS 81  BILITOT 0.1*  PROT 7.2  ALBUMIN 3.5   No results found for this basename: LIPASE, AMYLASE,  in the last 168 hours No results found for this basename: AMMONIA,  in the last 168 hours CBC:  Recent  Labs Lab 10/03/12 1907 10/04/12 0430  WBC 11.0* 9.1  NEUTROABS 5.7  --   HGB 13.8 13.4  HCT 40.4 37.8*  MCV 87.1 86.9  PLT 431* 426*   Cardiac Enzymes:  Recent Labs Lab 10/03/12 1907  CKTOTAL 33   BNP: BNP (last 3 results)  Recent Labs  09/21/12 2124  PROBNP 3389.0*   CBG:  Recent Labs Lab 10/04/12 0401 10/04/12 0759 10/04/12 1143 10/04/12 1608 10/06/12 1201  GLUCAP 112* 110* 108* 111* 104*    Time coordinating discharge: 45 minutes  Signed:  Zeanna Sunde  [facility] 10/06/2012, 4:42 PM

## 2012-10-08 NOTE — Progress Notes (Signed)
Utilization review completed.  

## 2012-10-11 NOTE — Discharge Summary (Signed)
Physician Discharge Summary  Bradley Day RUE:[id number] DOB: 1971-01-23 DOA: 09/21/2012  PCP: Bing Plume, DO, [place], [place]  Please note that patient requested his medical records NOT to be faxed to his primary care physician.  Admit date: 09/21/2012 Discharge date: 10/11/2012  Time spent: 40 minutes  Recommendations for Outpatient Follow-up:  1. Followup with primary care physician within one week. 2. Needs to determine about oral hypoglycemics  Discharge Diagnoses:  Principal Problem:   NSTEMI (non-ST elevated myocardial infarction) Active Problems:   Rhabdomyolysis   AKI (acute kidney injury)   Transaminitis   Hypokalemia   Diabetes mellitus   Chronic back pain   Nonischemic cardiomyopathy   Discharge Condition: Stable  Diet recommendation: Heart healthy diet  Filed Weights   09/21/12 2031  Weight: 81.647 kg (180 lb)    History of present illness:  42 year old male with a history of bipolar disorder, pulmonary embolism off anticoagulation after completion of 6 months on the Xarelto as per patient, chronic back pain, chronic opiate use who was initially admitted to the [facility] hospital on 09/20/2012 after patient was found to be unresponsive by his mother. Patient has been taking oxycodone, Neurontin and was recently started on amitriptyline 150 mg by mouth daily. Patient was intubated and sedated to protect the airway, today patient left AMA from [facility] after they told patient that he tried to commit suicide, as per patient. Patient came to the [facility], and was found to be hypoxic with O2 sats in 80s, urinary retention With bladder scan showing 600 cc of urine. The lab work done in the ED showed significant elevation of troponin of 2.57, his previous troponin from [facility] was 2.210. Him patient's total CPK is 23887, CK-MB 173.8. EKG done in the ED shows no acute changes.  At this time patient denies chest pain, no  shortness of breath his O2 sats have improved in the ED. Appears very restless, denies nausea vomiting or diarrhea. Admits to having some dysuria. No fever  Hospital Course:   1. NSTEMI: Patient presents to the hospital with elevated troponin of 2.57, his previous troponin from [facility] was 2.2. The time of admission cardiology was consulted, this is thought to be secondary to demand ischemia. Patient did have recent cardiopulmonary arrest assumed to be secondary to narcotics overdose with intubation and self extubation. Echocardiogram was done and showed grade 2 diastolic dysfunction with global hypokinesis. LexiScan Myoview stress test was done and showed no inducible ischemia LVEF of around 38-40%. Cardiology recommended 12.5 mg of metoprolol twice a day,  ACE inhibitor should be started in 2-3 weeks after his kidneys recover slowly from the ARF.   2. Rhabdomyolysis: Multifactorial primarily secondary to prolonged immobility after the patient had the cardiopulmonary arrest. CPK was 24,000 on admission was trending down. Patient had IV fluid of admission aggressive IV fluid hydration was continued. Metformin was discontinued.  3. Acute renal failure: Likely secondary to rhabdomyolysis, patient has transient urinary retention likely secondary to narcotics. Acute renal failure resolved after aggressive hydration with IV fluids.  4. Acute respiratory failure with hypoxia: Patient likely has COPD since he is smoking since his 42 year old. As mentioned above patient was presented to [facility] with hypoxic respiratory failure, patient was intubated and he self extubated. This is resolved. Gabapentin was discontinued prior to discharge.  5. Diabetes mellitus: Patient was on metformin prior to admission, this was discontinued because of the presentation with rhabdomyolysis. Hemoglobin A1c is 5.7  on 09/22/2012. Patient was discharged on a steroid taper for 7 days, his blood  sugar expected to be in the high side. Patient to followup with his primary care physician for further recommendation regarding is diabetes regimen.  6. Laryngitis: Patient suffers from hoarseness of voice and difficulty with swallowing after they traumatic extubation. Its and documented in the records and unclear if patient self extubated on he has had traumatic extubation. Patient said he was sleepy and he did not know what happened. Patient seen by ENT Dr. Pollyann Kennedy and the direct laryngoscopy showed no evidence of chord paralysis or joint dislocation but there is swelling from the traumatic extubation. Prednisone was given to decrease the inflammation/swelling and lidocaine viscous was prescribed on discharge to help with her swallowing.  7. Chronic pain: Patient follows with Dr. Vilma Prader, he reported that he is on 30 mg of OxyIR every 4 hours. He does not want any pain medication prescribed to him as that will affect his pain contract. Patient to followup with his primary care physician.  8. Nonischemic cardiomyopathy: As mentioned above patient 2-D echocardiogram showed global hypokinesis, the stress Myoview did not show any inducible ischemia. Patient to followup with cardiology, discharge on 12.5 mg of metoprolol, per Dr. Swaziland recommendation ACE inhibitor should be introduced in 2-3 weeks after his kidneys recover.  Procedures:  Direct laryngoscopy done by Dr. Pollyann Kennedy, showed laryngeal injury from traumatic extubation, no evidence of chord paralysis or arytenoid dislocation.  Consultations:  Serena Colonel of ENT.  Peter Swaziland of cardiology  Discharge Exam: Filed Vitals:   09/26/12 0519  BP: 125/79  Pulse: 76  Temp: 97.6 F (36.4 C)  Resp: 18   General: Alert and awake, oriented x3, not in any acute distress. HEENT: anicteric sclera, pupils reactive to light and accommodation, EOMI CVS: S1-S2 clear, no murmur rubs or gallops Chest: clear to auscultation bilaterally, no wheezing, rales or  rhonchi Abdomen: soft nontender, nondistended, normal bowel sounds, no organomegaly Extremities: no cyanosis, clubbing or edema noted bilaterally Neuro: Cranial nerves II-XII intact, no focal neurological deficits  Discharge Instructions      Discharge Orders   Future Orders Complete By Expires   Diet - low sodium heart healthy  As directed    Increase activity slowly  As directed        Medication List    STOP taking these medications       ALPRAZolam 0.5 MG tablet  Commonly known as:  XANAX     gabapentin 800 MG tablet  Commonly known as:  NEURONTIN     metFORMIN 500 MG tablet  Commonly known as:  GLUCOPHAGE     oxycodone 30 MG immediate release tablet  Commonly known as:  ROXICODONE      TAKE these medications       albuterol 108 (90 BASE) MCG/ACT inhaler  Commonly known as:  PROVENTIL HFA;VENTOLIN HFA  Inhale 2 puffs into the lungs every 6 (six) hours as needed for wheezing or shortness of breath.     omeprazole 20 MG capsule  Commonly known as:  PRILOSEC  Take 20 mg by mouth daily.     tiotropium 18 MCG inhalation capsule  Commonly known as:  SPIRIVA  Place 18 mcg into inhaler and inhale daily.       Allergies  Allergen Reactions  . Augmentin [Amoxicillin-Pot Clavulanate]     unknown  . Toradol [Ketorolac Tromethamine]     Severe headaches    Follow-up Information   Call Purvis, JEFRY,  MD. (If symptoms worsen)    Specialty:  Otolaryngology   Contact information:   [address] [place] [address] [phone]        The results of significant diagnostics from this hospitalization (including imaging, microbiology, ancillary and laboratory) are listed below for reference.    Significant Diagnostic Studies: Nm Myocar Multi W/spect W/wall Motion / Ef  09/24/2012   *RADIOLOGY REPORT*  Clinical Data:  Chest pain  MYOCARDIAL IMAGING WITH SPECT (REST AND PHARMACOLOGIC-STRESS) GATED LEFT VENTRICULAR WALL MOTION STUDY LEFT  VENTRICULAR EJECTION FRACTION  Technique:  Standard myocardial SPECT imaging was performed after resting intravenous injection of 10 mCi Tc-83m Sestamibi. Subsequently, intravenous infusion of regadenoson was performed under the supervision of the Cardiology staff.  At peak effect of the drug, 30 mCi Tc-75m Sestamibi was injected intravenously and standard myocardial SPECT  imaging was performed.  Quantitative gated imaging was also performed to evaluate left ventricular wall motion, and estimate left ventricular ejection fraction.  Comparison:  None.  Findings: The Lexiscan stress ECG will be reported elsewhere in the chart.  Gated images showed EF 37% with global hypokinesis.  The rest perfusion study was count-poor.  The stress images showed a mild, small basal to mid inferior perfusion defect.  There was significant subdiaphragmatic radiotracer uptake.  IMPRESSION: 1. EF 37% with global hypokinesis.  2. The rest study was count-poor.  There was a primarily fixed, small, mild basal to mid inferior perfusion defect.  This may have been due to attenuation artifact related to the subdiaphragmatic radiotracer uptake.  There is no ischemia.  3. Suspect nonischemic cardiomyopathy.  Overall intermediate risk study due to decreased EF but no ischemia noted.   Original Report Authenticated By: Marca Ancona   Dg Chest Port 1 View  09/21/2012   *RADIOLOGY REPORT*  Clinical Data: Hypoxia, COPD  PORTABLE CHEST - 1 VIEW  Comparison: 02/12/2012  Findings: Diffuse interstitial edema pattern throughout the lungs compatible with CHF.  Bibasilar atelectasis also evident.  No large effusion or pneumothorax.  Trachea midline.  IMPRESSION: Mild diffuse interstitial edema and basilar atelectasis.   Original Report Authenticated By: Judie Petit. Miles Costain, M.D.    Microbiology: No results found for this or any previous visit (from the past 240 hour(s)).   Labs: Basic Metabolic Panel: No results found for this basename: NA, K, CL, CO2,  GLUCOSE, BUN, CREATININE, CALCIUM, MG, PHOS,  in the last 168 hours Liver Function Tests: No results found for this basename: AST, ALT, ALKPHOS, BILITOT, PROT, ALBUMIN,  in the last 168 hours No results found for this basename: LIPASE, AMYLASE,  in the last 168 hours No results found for this basename: AMMONIA,  in the last 168 hours CBC: No results found for this basename: WBC, NEUTROABS, HGB, HCT, MCV, PLT,  in the last 168 hours Cardiac Enzymes: No results found for this basename: CKTOTAL, CKMB, CKMBINDEX, TROPONINI,  in the last 168 hours BNP: BNP (last 3 results)  Recent Labs  09/21/12 2124  PROBNP 3389.0*   CBG:  Recent Labs Lab 10/04/12 1608 10/06/12 1201  GLUCAP 111* 104*       Signed:  Lissett Favorite A  [facility] 10/11/2012, 12:22 PM

## 2012-11-03 ENCOUNTER — Encounter: Payer: Self-pay | Admitting: Cardiology

## 2012-11-03 ENCOUNTER — Ambulatory Visit (INDEPENDENT_AMBULATORY_CARE_PROVIDER_SITE_OTHER): Payer: Self-pay | Admitting: Cardiology

## 2012-11-03 VITALS — BP 108/58 | HR 95 | Ht 73.0 in | Wt 171.0 lb

## 2012-11-03 DIAGNOSIS — I214 Non-ST elevation (NSTEMI) myocardial infarction: Secondary | ICD-10-CM

## 2012-11-03 DIAGNOSIS — I428 Other cardiomyopathies: Secondary | ICD-10-CM

## 2012-11-03 MED ORDER — LISINOPRIL 2.5 MG PO TABS: 2.5000 mg | ORAL_TABLET | Freq: Every day | ORAL | Status: DC

## 2012-11-03 NOTE — Progress Notes (Signed)
Bradley Day Date of Birth: [birth year] Medical Record #[id number]  History of Present Illness: Bradley Day is seen today for followup. He is a 42 year old white male who I saw in hospital in early August. He presented with rhabdomyolysis and acute renal failure. He had elevated cardiac enzymes as well with normal ECG. Subsequent echocardiogram demonstrated an ejection fraction of 45%. He had a Myoview study which showed normal perfusion and an ejection fraction of 37%. He was started on metoprolol. Patient's renal function improved. He was discharged home. He was readmitted on August 15 with acute withdrawal from narcotics. He was referred to a methadone clinic. He reports he is still having some back pain. His chest pain has improved significantly. He was taken off of his metoprolol because of hypotension. He denies any significant dyspnea or any edema.  Current Outpatient Prescriptions on File Prior to Visit  Medication Sig Dispense Refill  . albuterol (PROVENTIL HFA;VENTOLIN HFA) 108 (90 BASE) MCG/ACT inhaler Inhale 2 puffs into the lungs every 6 (six) hours as needed for wheezing or shortness of breath.      Marland Kitchen aspirin 81 MG tablet Take 81 mg by mouth daily.      Marland Kitchen gabapentin (NEURONTIN) 400 MG capsule Take 2 capsules (800 mg total) by mouth 3 (three) times daily.  180 capsule  0   No current facility-administered medications on file prior to visit.    Allergies  Allergen Reactions  . Augmentin [Amoxicillin-Pot Clavulanate]     unknown  . Toradol [Ketorolac Tromethamine]     Severe headaches     Past Medical History  Diagnosis Date  . Emphysema   . Depression   . Bipolar 1 disorder   . Pulmonary embolism August 2013    off anticoagulation 12/2011 per pt  . IV drug user     last IV drug use in 2011  . NSTEMI (non-ST elevated myocardial infarction)   . Rhabdomyolysis   . Cardiomyopathy     Past Surgical History  Procedure Laterality Date  . Hand surgery      History    Smoking status  . Current Every Day Smoker -- 1.00 packs/day  . Types: Cigarettes  Smokeless tobacco  . Not on file    History  Alcohol Use     Comment: occasional    Family History  Problem Relation Age of Onset  . CAD Father   . CAD Maternal Grandmother   . Hypertension Maternal Grandfather   . Diabetes Mother   . Diabetes Maternal Grandfather   . Cancer - Prostate Maternal Grandfather     Review of Systems: As noted in history of present illness..  All other systems were reviewed and are negative.  Physical Exam: BP 108/58  Pulse 95  Ht 6\' 1"  (1.854 m)  Wt 171 lb (77.565 kg)  BMI 22.57 kg/m2 He is a chronically ill-appearing white male in no acute distress. HEENT normal Neck: No adenopathy or JVD. No bruits. Lungs: Clear Cardiovascular: Regular rate and rhythm. Normal S1 and S2. No gallop, murmur, or click. Abdomen: Soft and nontender. No masses. Extremities: No edema or cyanosis. Pedal pulses are 2+. Neuro: Anxious. Cranial nerves II through XII are intact.  LABORATORY DATA:   Assessment / Plan: 1. Nonischemic cardiomyopathy with presentation of rhabdomyolysis. Medical therapy limited by low blood pressure. Blood pressure appears to be improved today. We will try him on low-dose lisinopril 2.5 mg daily. His last creatinine was normal. I'll followup again in 6 weeks and we'll  repeat a basic metabolic panel at that time. Ideally I would recommend repeating an echocardiogram in 3 months to reassess LV function. 2. Rhabdomyolysis. 3. History of drug abuse-narcotics.

## 2012-11-03 NOTE — Patient Instructions (Signed)
We will try lisinopril 2.5 mg daily.  I will see you in 6 weeks.

## 2012-12-16 ENCOUNTER — Ambulatory Visit (INDEPENDENT_AMBULATORY_CARE_PROVIDER_SITE_OTHER): Payer: Self-pay | Admitting: Cardiovascular Disease

## 2012-12-16 ENCOUNTER — Encounter: Payer: Self-pay | Admitting: Cardiovascular Disease

## 2012-12-16 ENCOUNTER — Other Ambulatory Visit: Payer: Self-pay | Admitting: *Deleted

## 2012-12-16 VITALS — BP 129/84 | HR 87 | Ht 73.0 in | Wt 166.0 lb

## 2012-12-16 DIAGNOSIS — F1110 Opioid abuse, uncomplicated: Secondary | ICD-10-CM

## 2012-12-16 DIAGNOSIS — I428 Other cardiomyopathies: Secondary | ICD-10-CM

## 2012-12-16 DIAGNOSIS — I10 Essential (primary) hypertension: Secondary | ICD-10-CM

## 2012-12-16 DIAGNOSIS — I214 Non-ST elevation (NSTEMI) myocardial infarction: Secondary | ICD-10-CM

## 2012-12-16 DIAGNOSIS — E46 Unspecified protein-calorie malnutrition: Secondary | ICD-10-CM

## 2012-12-16 DIAGNOSIS — F17200 Nicotine dependence, unspecified, uncomplicated: Secondary | ICD-10-CM

## 2012-12-16 DIAGNOSIS — Z716 Tobacco abuse counseling: Secondary | ICD-10-CM

## 2012-12-16 DIAGNOSIS — Z7189 Other specified counseling: Secondary | ICD-10-CM

## 2012-12-16 DIAGNOSIS — F1310 Sedative, hypnotic or anxiolytic abuse, uncomplicated: Secondary | ICD-10-CM

## 2012-12-16 MED ORDER — METOPROLOL TARTRATE 25 MG PO TABS: 25.0000 mg | ORAL_TABLET | Freq: Two times a day (BID) | ORAL | Status: DC

## 2012-12-16 MED ORDER — LISINOPRIL 5 MG PO TABS: 5.0000 mg | ORAL_TABLET | Freq: Every day | ORAL | Status: DC

## 2012-12-16 NOTE — Patient Instructions (Signed)
Your physician recommends that you schedule a follow-up appointment in: 6 MONTHS  Your physician has recommended you make the following change in your medication:   1) INCREASE YOUR LISINOPRIL TO 5MG  ONCE DAILY (YOU MAY TAKE 2 TABLETS OF YOUR CURRENT DOSAGE OF 2.5MG  UNTIL PRESCRIPTION IS COMPLETED) 2) START TAKING METOPROLOL 12.5MG  TWICE DAILY (TAKE HALF OF THE 25MG  TABLET)   Your physician has requested that you have an echocardiogram. Echocardiography is a painless test that uses sound waves to create images of your heart. It provides your doctor with information about the size and shape of your heart and how well your heart's chambers and valves are working. This procedure takes approximately one hour. There are no restrictions for this procedure.IN January, WE WILL CALL YOU TO SCHEDULE THIS APPOINTMENT FOR YOU

## 2012-12-16 NOTE — Addendum Note (Signed)
Addended by: Derry Lory A on: 12/16/2012 10:53 AM   Modules accepted: Orders

## 2012-12-16 NOTE — Progress Notes (Signed)
Patient ID: Bradley Day, male   DOB: Mar 13, 1970, 42 y.o.   MRN: [id number]      SUBJECTIVE: The patient is a 42 year old white male who I am seeing for the first time, and was most recently seen by Dr. Swaziland in September. He reportedly was hospitalized for rhabdomyolysis and acute renal failure. He had elevated cardiac enzymes as well with normal ECG. Subsequent echocardiogram demonstrated an ejection fraction of 45-50% with grade II diastolic dysfunction. He had a Lexiscan study on 09-24-12 which showed normal perfusion and an ejection fraction of 37%. He was started on metoprolol. Patient's renal function improved. He was discharged home. He was readmitted on August 15 with acute withdrawal from narcotics. He was referred to a methadone clinic.   He was started on lisinopril 2.5 mg daily and beta blockers could not be started due to a relatively low BP. He currently denies chest pain and only c/o lower back pain. He would like to start exercising again. He says he usually has a lo BP of 90/60. He is currently not on pain meds as he stopped going to one methadone clinic and is about to get enrolled in another one.  He wants to know how to gain weight, as he's lost 30 lbs since 6/10.    Allergies  Allergen Reactions  . Augmentin [Amoxicillin-Pot Clavulanate]     unknown  . Toradol [Ketorolac Tromethamine]     Severe headaches     Current Outpatient Prescriptions  Medication Sig Dispense Refill  . albuterol (PROVENTIL HFA;VENTOLIN HFA) 108 (90 BASE) MCG/ACT inhaler Inhale 2 puffs into the lungs every 6 (six) hours as needed for wheezing or shortness of breath.      . ALPRAZolam (XANAX) 0.5 MG tablet Take 0.5 mg by mouth 3 (three) times daily.      Marland Kitchen aspirin 81 MG tablet Take 81 mg by mouth daily.      Marland Kitchen gabapentin (NEURONTIN) 400 MG capsule Take 2 capsules (800 mg total) by mouth 3 (three) times daily.  180 capsule  0  . lisinopril (PRINIVIL,ZESTRIL) 2.5 MG tablet Take 1 tablet (2.5 mg  total) by mouth daily.  90 tablet  3  . ranitidine (ZANTAC) 150 MG capsule Take 150 mg by mouth 2 (two) times daily.       No current facility-administered medications for this visit.    Past Medical History  Diagnosis Date  . Emphysema   . Depression   . Bipolar 1 disorder   . Pulmonary embolism August 2013    off anticoagulation 12/2011 per pt  . IV drug user     last IV drug use in 2011  . NSTEMI (non-ST elevated myocardial infarction)   . Rhabdomyolysis   . Cardiomyopathy     Past Surgical History  Procedure Laterality Date  . Hand surgery      History   Social History  . Marital Status: Married    Spouse Name: N/A    Number of Children: N/A  . Years of Education: N/A   Occupational History  . Not on file.   Social History Main Topics  . Smoking status: Current Every Day Smoker -- 1.00 packs/day    Types: Cigarettes  . Smokeless tobacco: Not on file  . Alcohol Use:      Comment: occasional  . Drug Use: Yes    Special: Cocaine, Marijuana, IV     Comment: crack  . Sexual Activity:    Other Topics Concern  . Not on  file   Social History Narrative  . No narrative on file     Filed Vitals:   12/16/12 1005  BP: 129/84  Pulse: 87  Height: 6\' 1"  (1.854 m)  Weight: 166 lb (75.297 kg)    PHYSICAL EXAM General: NAD Neck: No JVD, no thyromegaly or thyroid nodule.  Lungs: Clear to auscultation bilaterally with normal respiratory effort. CV: Nondisplaced PMI.  Heart regular S1/S2, no S3/S4, no murmur.  No peripheral edema.  No carotid bruit.  Normal pedal pulses.  Abdomen: Soft, nontender, no hepatosplenomegaly, no distention.  Neurologic: Alert and oriented x 3.  Psych: Normal affect. Extremities: No clubbing or cyanosis.   ECG: reviewed and available in electronic records.  Echo (09-22-2012): - Left ventricle: The cavity size was mildly dilated. Wall thickness was normal. Systolic function was mildly reduced. The estimated ejection fraction was in  the range of 45% to 50%. Diffuse hypokinesis. Features are consistent with a pseudonormal left ventricular filling pattern, with concomitant abnormal relaxation and increased filling pressure (grade 2 diastolic dysfunction). - Aortic valve: There was no stenosis. - Mitral valve: No significant regurgitation. - Right ventricle: The cavity size was normal. Systolic function was normal. - Tricuspid valve: Peak RV-RA gradient: 26mm Hg (S). - Pulmonary arteries: PA peak pressure: 31mm Hg (S). - Inferior vena cava: The vessel was normal in size; the respirophasic diameter changes were in the normal range (= 50%); findings are consistent with normal central venous pressure. Impressions:  - Technically difficult study with poor acoustic windows. Mildly dilated LV with mild global hypokinesis, EF 45-50%. Normal RV size and systolic function. No significant valvular abnormalities.     ASSESSMENT AND PLAN: 1. Nonischemic cardiomyopathy: EF 45-50% on 09/24/12, and low-dose lisinopril 2.5 mg daily started in 10/2012. I will increase lisinopril to 5 mg daily and start low-dose metoprolol at 12.5 mg bid. I will repeat an echocardiogram in late January/early February 2015 to assess for an interval improvement in systolic function. 2. Narcotic abuse: he requests pain meds but I told him that he should continue with his plan to enroll in a pain management clinic next week. 3. Tobacco abuse: I counseled him on cessation. 4. Malnutrition: I provided him with education on means to increase weight and strength, as well as to improve overall health.  Dispo: f/u 6 months.   Prentice Docker, M.D., F.A.C.C.

## 2012-12-16 NOTE — Addendum Note (Signed)
Addended by: Derry Lory A on: 12/16/2012 10:44 AM   Modules accepted: Orders

## 2012-12-17 ENCOUNTER — Ambulatory Visit: Payer: 59 | Admitting: Cardiology

## 2012-12-17 ENCOUNTER — Other Ambulatory Visit: Payer: 59

## 2012-12-25 ENCOUNTER — Other Ambulatory Visit: Payer: Self-pay

## 2012-12-30 ENCOUNTER — Telehealth: Payer: Self-pay | Admitting: Cardiovascular Disease

## 2012-12-30 NOTE — Telephone Encounter (Signed)
Wants patient to be referred to Pulmonary physician.  Advised that that should be handled by PCP.  States that patient does not have PCP. / tgs

## 2012-12-31 NOTE — Telephone Encounter (Signed)
Would you like to refer this pt to Pulmonary. Pt does not have PCP. Would you like me to refer or help him find PCP?

## 2012-12-31 NOTE — Telephone Encounter (Signed)
Pt has no insurance. Told pt about [facility] in [place] that accepts pts with No insurance. Pt States he does not want to go to the pulmonary doctor in [place] due to owing money. Pt would rather be seen in the [place] system. Pt states he will try [facility] and if that does not work pt will call back.

## 2012-12-31 NOTE — Telephone Encounter (Signed)
Ok to help pt find PCP.

## 2013-03-02 ENCOUNTER — Ambulatory Visit (HOSPITAL_COMMUNITY)
Admission: RE | Admit: 2013-03-02 | Discharge: 2013-03-02 | Disposition: A | Discharge status: Home / Self Care | Payer: Self-pay | Source: Ambulatory Visit | Attending: Cardiovascular Disease | Admitting: Cardiovascular Disease

## 2013-03-02 DIAGNOSIS — I10 Essential (primary) hypertension: Secondary | ICD-10-CM | POA: Insufficient documentation

## 2013-03-02 DIAGNOSIS — I2699 Other pulmonary embolism without acute cor pulmonale: Secondary | ICD-10-CM | POA: Insufficient documentation

## 2013-03-02 DIAGNOSIS — I252 Old myocardial infarction: Secondary | ICD-10-CM | POA: Insufficient documentation

## 2013-03-02 DIAGNOSIS — I428 Other cardiomyopathies: Secondary | ICD-10-CM | POA: Insufficient documentation

## 2013-03-02 DIAGNOSIS — I517 Cardiomegaly: Secondary | ICD-10-CM

## 2013-03-02 NOTE — Progress Notes (Signed)
*  PRELIMINARY RESULTS* Echocardiogram 2D Echocardiogram has been performed.  Branden Shallenberger 03/02/2013, 11:26 AM

## 2013-03-11 ENCOUNTER — Telehealth: Payer: Self-pay | Admitting: Cardiovascular Disease

## 2013-03-11 DIAGNOSIS — R52 Pain, unspecified: Secondary | ICD-10-CM

## 2013-03-11 NOTE — Telephone Encounter (Signed)
Please advise if ok to refer pt to pain clinic

## 2013-03-11 NOTE — Telephone Encounter (Signed)
Would like referral to pain clinic.  States that he does not have PCP. / tgs

## 2013-03-12 NOTE — Telephone Encounter (Signed)
Ok to refer to pain clinic

## 2013-03-12 NOTE — Telephone Encounter (Signed)
Referred pt to Dr Laurian Brim in [place].

## 2013-03-23 ENCOUNTER — Encounter (HOSPITAL_COMMUNITY): Payer: Self-pay | Admitting: Emergency Medicine

## 2013-03-23 ENCOUNTER — Emergency Department (HOSPITAL_COMMUNITY): Payer: Self-pay

## 2013-03-23 ENCOUNTER — Emergency Department (HOSPITAL_COMMUNITY)
Admission: EM | Admit: 2013-03-23 | Discharge: 2013-03-23 | Disposition: A | Discharge status: Home / Self Care | Payer: Self-pay | Attending: Emergency Medicine | Admitting: Emergency Medicine

## 2013-03-23 DIAGNOSIS — F319 Bipolar disorder, unspecified: Secondary | ICD-10-CM | POA: Insufficient documentation

## 2013-03-23 DIAGNOSIS — R509 Fever, unspecified: Secondary | ICD-10-CM | POA: Insufficient documentation

## 2013-03-23 DIAGNOSIS — IMO0002 Reserved for concepts with insufficient information to code with codable children: Secondary | ICD-10-CM | POA: Insufficient documentation

## 2013-03-23 DIAGNOSIS — N289 Disorder of kidney and ureter, unspecified: Secondary | ICD-10-CM | POA: Insufficient documentation

## 2013-03-23 DIAGNOSIS — N2889 Other specified disorders of kidney and ureter: Secondary | ICD-10-CM

## 2013-03-23 DIAGNOSIS — Z8739 Personal history of other diseases of the musculoskeletal system and connective tissue: Secondary | ICD-10-CM | POA: Insufficient documentation

## 2013-03-23 DIAGNOSIS — Z79899 Other long term (current) drug therapy: Secondary | ICD-10-CM | POA: Insufficient documentation

## 2013-03-23 DIAGNOSIS — J438 Other emphysema: Secondary | ICD-10-CM | POA: Insufficient documentation

## 2013-03-23 DIAGNOSIS — R112 Nausea with vomiting, unspecified: Secondary | ICD-10-CM | POA: Insufficient documentation

## 2013-03-23 DIAGNOSIS — I252 Old myocardial infarction: Secondary | ICD-10-CM | POA: Insufficient documentation

## 2013-03-23 DIAGNOSIS — F17200 Nicotine dependence, unspecified, uncomplicated: Secondary | ICD-10-CM | POA: Insufficient documentation

## 2013-03-23 DIAGNOSIS — Z86711 Personal history of pulmonary embolism: Secondary | ICD-10-CM | POA: Insufficient documentation

## 2013-03-23 DIAGNOSIS — R109 Unspecified abdominal pain: Secondary | ICD-10-CM | POA: Insufficient documentation

## 2013-03-23 LAB — CBC WITH DIFFERENTIAL/PLATELET
BASOS PCT: 0 % (ref 0–1)
Basophils Absolute: 0 10*3/uL (ref 0.0–0.1)
EOS ABS: 0.3 10*3/uL (ref 0.0–0.7)
Eosinophils Relative: 3 % (ref 0–5)
HCT: 40.4 % (ref 39.0–52.0)
Hemoglobin: 14.4 g/dL (ref 13.0–17.0)
Lymphocytes Relative: 20 % (ref 12–46)
Lymphs Abs: 2.1 10*3/uL (ref 0.7–4.0)
MCH: 31.4 pg (ref 26.0–34.0)
MCHC: 35.6 g/dL (ref 30.0–36.0)
MCV: 88.2 fL (ref 78.0–100.0)
MONO ABS: 1.2 10*3/uL — AB (ref 0.1–1.0)
MONOS PCT: 11 % (ref 3–12)
NEUTROS PCT: 66 % (ref 43–77)
Neutro Abs: 6.9 10*3/uL (ref 1.7–7.7)
Platelets: 354 10*3/uL (ref 150–400)
RBC: 4.58 MIL/uL (ref 4.22–5.81)
RDW: 12.7 % (ref 11.5–15.5)
WBC: 10.5 10*3/uL (ref 4.0–10.5)

## 2013-03-23 LAB — COMPREHENSIVE METABOLIC PANEL
ALBUMIN: 3.9 g/dL (ref 3.5–5.2)
ALT: 21 U/L (ref 0–53)
AST: 18 U/L (ref 0–37)
Alkaline Phosphatase: 107 U/L (ref 39–117)
BUN: 13 mg/dL (ref 6–23)
CALCIUM: 9.7 mg/dL (ref 8.4–10.5)
CO2: 30 mEq/L (ref 19–32)
Chloride: 97 mEq/L (ref 96–112)
Creatinine, Ser: 0.81 mg/dL (ref 0.50–1.35)
GFR calc non Af Amer: >90 mL/min (ref >90)
GLUCOSE: 95 mg/dL (ref 70–99)
Potassium: 3.8 mEq/L (ref 3.7–5.3)
SODIUM: 139 meq/L (ref 137–147)
TOTAL PROTEIN: 7.9 g/dL (ref 6.0–8.3)
Total Bilirubin: 0.3 mg/dL (ref 0.3–1.2)

## 2013-03-23 LAB — LIPASE, BLOOD: Lipase: 14 U/L (ref 11–59)

## 2013-03-23 MED ORDER — ONDANSETRON HCL 4 MG/2ML IJ SOLN
4.0000 mg | Freq: Once | INTRAMUSCULAR | Status: AC
  Administered 2013-03-23: 4 mg via INTRAVENOUS
  Filled 2013-03-23: qty 2

## 2013-03-23 MED ORDER — IOHEXOL 300 MG/ML  SOLN
50.0000 mL | Freq: Once | INTRAMUSCULAR | Status: AC | PRN
  Administered 2013-03-23: 50 mL via ORAL

## 2013-03-23 MED ORDER — HYDROMORPHONE HCL PF 1 MG/ML IJ SOLN
1.0000 mg | Freq: Once | INTRAMUSCULAR | Status: AC
  Administered 2013-03-23: 1 mg via INTRAVENOUS
  Filled 2013-03-23: qty 1

## 2013-03-23 MED ORDER — IOHEXOL 300 MG/ML  SOLN
100.0000 mL | Freq: Once | INTRAMUSCULAR | Status: AC | PRN
  Administered 2013-03-23: 100 mL via INTRAVENOUS

## 2013-03-23 MED ORDER — HYDROCODONE-ACETAMINOPHEN 5-325 MG PO TABS: 1.0000 | ORAL_TABLET | Freq: Four times a day (QID) | ORAL | Status: DC | PRN

## 2013-03-23 MED ORDER — SODIUM CHLORIDE 0.9 % IV BOLUS (SEPSIS)
1000.0000 mL | Freq: Once | INTRAVENOUS | Status: AC
  Administered 2013-03-23: 1000 mL via INTRAVENOUS

## 2013-03-23 MED ORDER — SODIUM CHLORIDE 0.9 % IV SOLN
INTRAVENOUS | Status: DC
  Administered 2013-03-23: 19:00:00 via INTRAVENOUS

## 2013-03-23 MED ORDER — PROMETHAZINE HCL 25 MG PO TABS: 25.0000 mg | ORAL_TABLET | Freq: Four times a day (QID) | ORAL | Status: DC | PRN

## 2013-03-23 MED ORDER — ONDANSETRON 4 MG PO TBDP: 4.0000 mg | ORAL_TABLET | Freq: Three times a day (TID) | ORAL | Status: DC | PRN

## 2013-03-23 NOTE — Discharge Instructions (Signed)
Abdominal Pain, Adult Many things can cause belly (abdominal) pain. Most times, the belly pain is not dangerous. Many cases of belly pain can be watched and treated at home. HOME CARE   Do not take medicines that help you go poop (laxatives) unless told to by your doctor.  Only take medicine as told by your doctor.  Eat or drink as told by your doctor. Your doctor will tell you if you should be on a special diet. GET HELP IF:  You do not know what is causing your belly pain.  You have belly pain while you are sick to your stomach (nauseous) or have runny poop (diarrhea).  You have pain while you pee or poop.  Your belly pain wakes you up at night.  You have belly pain that gets worse or better when you eat.  You have belly pain that gets worse when you eat fatty foods. GET HELP RIGHT AWAY IF:   The pain does not go away within 2 hours.  You have a fever.  You keep throwing up (vomiting).  The pain changes and is only in the right or left part of the belly.  You have bloody or tarry looking poop. MAKE SURE YOU:   Understand these instructions.  Will watch your condition.  Will get help right away if you are not doing well or get worse. Document Released: 07/25/2007 Document Revised: 11/26/2012 Document Reviewed: 10/15/2012 [facility] Patient Information 2014 [place], [place].  CT scan without any significant findings other than some indeterminate mass on the left kidney they recommend outpatient MRI to further evaluate this. Labs here today were all normal. Will treat with pain medicine take as directed and 2 different types of anion nausea vomiting medicine take as directed. MRI can be arranged here Monday through Friday during normal working hours through the ED. Or you can have your doctor up in [place] schedule the MRI for you.

## 2013-03-23 NOTE — ED Notes (Signed)
Complain of abdominal pain, n/v for about eight days.

## 2013-03-23 NOTE — ED Notes (Signed)
Pt alert & oriented x4, stable gait. Patient given discharge instructions, paperwork & prescription(s). Patient  instructed to stop at the registration desk to finish any additional paperwork. Patient verbalized understanding. Pt left department w/ no further questions. 

## 2013-03-23 NOTE — ED Provider Notes (Signed)
CSN: [id number]     Arrival date & time 03/23/13  1700 History   First MD Initiated Contact with Patient 03/23/13 1751     Chief Complaint  Patient presents with  . Abdominal Pain   (Consider location/radiation/quality/duration/timing/severity/associated sxs/prior Treatment) Patient is a 43 y.o. male presenting with abdominal pain. The history is provided by the patient.  Abdominal Pain Associated symptoms: fever, nausea and vomiting   Associated symptoms: no chest pain, no diarrhea, no dysuria, no hematuria and no shortness of breath    patient from [place]. Patient with complaint of abdominal pain nausea and vomiting for about 8 days. The abdominal pain is somewhat diffuse. No blood in the vomit. Patient's had a history of rectal bleeding in the past but hasn't seen any blood stain the cold water completely red hasn't seen any blood streaking the stool in the last few days. Patient states socially fever no dysuria. Patient describes abdominal pain as an ache. It is mostly concerned about the persistent nausea and vomiting..  Past Medical History  Diagnosis Date  . Emphysema   . Depression   . Bipolar 1 disorder   . Pulmonary embolism August 2013    off anticoagulation 12/2011 per pt  . IV drug user     last IV drug use in 2011  . NSTEMI (non-ST elevated myocardial infarction)   . Rhabdomyolysis   . Cardiomyopathy    Past Surgical History  Procedure Laterality Date  . Hand surgery     Family History  Problem Relation Age of Onset  . CAD Father   . CAD Maternal Grandmother   . Hypertension Maternal Grandfather   . Diabetes Mother   . Diabetes Maternal Grandfather   . Cancer - Prostate Maternal Grandfather    History  Substance Use Topics  . Smoking status: Current Every Day Smoker -- 1.00 packs/day    Types: Cigarettes  . Smokeless tobacco: Not on file  . Alcohol Use: No     Comment: occasional    Review of Systems  Constitutional: Positive for fever.  HENT:  Negative for congestion.   Eyes: Negative for redness.  Respiratory: Negative for shortness of breath.   Cardiovascular: Negative for chest pain.  Gastrointestinal: Positive for nausea, vomiting and abdominal pain. Negative for diarrhea.  Genitourinary: Negative for dysuria and hematuria.  Musculoskeletal: Negative for back pain.  Skin: Negative for rash.  Neurological: Negative for headaches.  Hematological: Does not bruise/bleed easily.  Psychiatric/Behavioral: Negative for confusion.    Allergies  Augmentin and Toradol  Home Medications   Current Outpatient Rx  Name  Route  Sig  Dispense  Refill  . albuterol (PROVENTIL HFA;VENTOLIN HFA) 108 (90 BASE) MCG/ACT inhaler   Inhalation   Inhale 2 puffs into the lungs every 6 (six) hours as needed for wheezing or shortness of breath.         . gabapentin (NEURONTIN) 400 MG capsule   Oral   Take 2 capsules (800 mg total) by mouth 3 (three) times daily.   180 capsule   0   . lisinopril (PRINIVIL,ZESTRIL) 5 MG tablet   Oral   Take 1 tablet (5 mg total) by mouth daily.   90 tablet   3   . methadone (DOLOPHINE) 10 MG/ML solution   Oral   Take 100 mg by mouth daily. At methadone clinic         . metoprolol tartrate (LOPRESSOR) 25 MG tablet   Oral   Take 12.5 mg by mouth 2 (  two) times daily.         . predniSONE (DELTASONE) 10 MG tablet   Oral   Take 10 mg by mouth daily.         Marland Kitchen PRESCRIPTION MEDICATION   Oral   Take 1 tablet by mouth daily. Acid reflux medication         . HYDROcodone-acetaminophen (NORCO/VICODIN) 5-325 MG per tablet   Oral   Take 1-2 tablets by mouth every 6 (six) hours as needed for moderate pain.   20 tablet   0   . ondansetron (ZOFRAN ODT) 4 MG disintegrating tablet   Oral   Take 1 tablet (4 mg total) by mouth every 8 (eight) hours as needed.   12 tablet   1   . promethazine (PHENERGAN) 25 MG tablet   Oral   Take 1 tablet (25 mg total) by mouth every 6 (six) hours as needed for  nausea or vomiting.   12 tablet   0    BP 106/68  Pulse 87  Temp(Src) 97.7 F (36.5 C) (Oral)  Resp 18  Ht 6\' 1"  (1.854 m)  Wt 150 lb (68.04 kg)  BMI 19.79 kg/m2  SpO2 96% Physical Exam  Nursing note and vitals reviewed. Constitutional: He is oriented to person, place, and time. He appears well-developed and well-nourished. No distress.  HENT:  Head: Normocephalic and atraumatic.  Mouth/Throat: Oropharynx is clear and moist.  Eyes: EOM are normal. Pupils are equal, round, and reactive to light.  Neck: Normal range of motion.  Cardiovascular: Normal rate, regular rhythm and normal heart sounds.   No murmur heard. Pulmonary/Chest: Effort normal and breath sounds normal. No respiratory distress.  Abdominal: Soft. Bowel sounds are normal. There is no tenderness.  Musculoskeletal: Normal range of motion. He exhibits no edema.  Neurological: He is alert and oriented to person, place, and time. No cranial nerve deficit. He exhibits normal muscle tone. Coordination normal.  Skin: Skin is warm. No rash noted.    ED Course  Procedures (including critical care time) Labs Review Labs Reviewed  CBC WITH DIFFERENTIAL - Abnormal; Notable for the following:    Monocytes Absolute 1.2 (*)    All other components within normal limits  LIPASE, BLOOD  COMPREHENSIVE METABOLIC PANEL   Results for orders placed during the hospital encounter of 03/23/13  CBC WITH DIFFERENTIAL      Result Value Range   WBC 10.5  4.0 - 10.5 K/uL   RBC 4.58  4.22 - 5.81 MIL/uL   Hemoglobin 14.4  13.0 - 17.0 g/dL   HCT 69.5  07.2 - 25.7 %   MCV 88.2  78.0 - 100.0 fL   MCH 31.4  26.0 - 34.0 pg   MCHC 35.6  30.0 - 36.0 g/dL   RDW 50.5  18.3 - 35.8 %   Platelets 354  150 - 400 K/uL   Neutrophils Relative % 66  43 - 77 %   Neutro Abs 6.9  1.7 - 7.7 K/uL   Lymphocytes Relative 20  12 - 46 %   Lymphs Abs 2.1  0.7 - 4.0 K/uL   Monocytes Relative 11  3 - 12 %   Monocytes Absolute 1.2 (*) 0.1 - 1.0 K/uL    Eosinophils Relative 3  0 - 5 %   Eosinophils Absolute 0.3  0.0 - 0.7 K/uL   Basophils Relative 0  0 - 1 %   Basophils Absolute 0.0  0.0 - 0.1 K/uL  LIPASE, BLOOD  Result Value Range   Lipase 14  11 - 59 U/L  COMPREHENSIVE METABOLIC PANEL      Result Value Range   Sodium 139  137 - 147 mEq/L   Potassium 3.8  3.7 - 5.3 mEq/L   Chloride 97  96 - 112 mEq/L   CO2 30  19 - 32 mEq/L   Glucose, Bld 95  70 - 99 mg/dL   BUN 13  6 - 23 mg/dL   Creatinine, Ser 1.61  0.50 - 1.35 mg/dL   Calcium 9.7  8.4 - 09.6 mg/dL   Total Protein 7.9  6.0 - 8.3 g/dL   Albumin 3.9  3.5 - 5.2 g/dL   AST 18  0 - 37 U/L   ALT 21  0 - 53 U/L   Alkaline Phosphatase 107  39 - 117 U/L   Total Bilirubin 0.3  0.3 - 1.2 mg/dL   GFR calc non Af Amer >90  >90 mL/min   GFR calc Af Amer >90  >90 mL/min    Imaging Review Ct Abdomen Pelvis W Contrast  03/23/2013   CLINICAL DATA:  Nausea and vomiting for 8 days.  EXAM: CT ABDOMEN AND PELVIS WITH CONTRAST  TECHNIQUE: Multidetector CT imaging of the abdomen and pelvis was performed using the standard protocol following bolus administration of intravenous contrast.  CONTRAST:  50 mL OMNIPAQUE IOHEXOL 300 MG/ML SOLN, 100 mL OMNIPAQUE IOHEXOL 300 MG/ML SOLN  COMPARISON:  None.  FINDINGS: The lung bases are clear.  No pleural or pericardial effusion.  Portal venous gas is identified. The stomach is distended but otherwise unremarkable. The small and large bowel appear normal. No pneumatosis, free intraperitoneal air, bowel wall thickening or fluid collection is identified. The spleen, adrenal glands, right kidney and pancreas appear normal. There is an exophytic lesion off the upper pole the right kidney measuring 2.0 cm in diameter with Hounsfield unit measurements of 87.3 on early imaging and 81.8 on delayed imaging. Second exophytic lesion off the mid pole of right kidney measures 1.5 cm in diameter with Hounsfield unit measurements of 36.8 on early imaging and 32.6 Hounsfield  units on delayed imaging. No lymphadenopathy is seen. No focal bony abnormality is identified.  IMPRESSION: Portal venous gas. Small and large bowel appear normal throughout without evidence of ischemia. Portal venous gas is nonspecific and can be seen in a variety of conditions including COPD.  Indeterminate left renal lesions. MRI with contrast is recommended for characterization.   Electronically Signed   By: Drusilla Kanner M.D.   On: 03/23/2013 20:53    EKG Interpretation   None       MDM   1. Abdominal pain   2. Nausea & vomiting   3. Renal mass    Workup for the abdominal pain and persistent nausea and vomiting without any significant findings on the leukocytosis no evidence of any hepatic abnormalities. CT scan negative except for indeterminate the mass on the left kidney patient told her the importance of this and the need to get an MRI as an outpatient. Patient will followup. Patient we treated with pain medicine and antinausea medicine. Followup with his doctor in [place].    Shelda Jakes, MD 03/23/13 2152

## 2013-03-23 NOTE — ED Notes (Signed)
Pt vomited, EDP aware

## 2013-05-10 ENCOUNTER — Encounter (HOSPITAL_COMMUNITY): Payer: Self-pay | Admitting: Emergency Medicine

## 2013-05-10 ENCOUNTER — Emergency Department (HOSPITAL_COMMUNITY)
Admission: EM | Admit: 2013-05-10 | Discharge: 2013-05-10 | Disposition: A | Discharge status: Home / Self Care | Payer: Self-pay | Attending: Emergency Medicine | Admitting: Emergency Medicine

## 2013-05-10 ENCOUNTER — Emergency Department (HOSPITAL_COMMUNITY): Payer: Self-pay

## 2013-05-10 DIAGNOSIS — Z8739 Personal history of other diseases of the musculoskeletal system and connective tissue: Secondary | ICD-10-CM | POA: Insufficient documentation

## 2013-05-10 DIAGNOSIS — I509 Heart failure, unspecified: Secondary | ICD-10-CM | POA: Insufficient documentation

## 2013-05-10 DIAGNOSIS — Z8709 Personal history of other diseases of the respiratory system: Secondary | ICD-10-CM | POA: Insufficient documentation

## 2013-05-10 DIAGNOSIS — F17200 Nicotine dependence, unspecified, uncomplicated: Secondary | ICD-10-CM | POA: Insufficient documentation

## 2013-05-10 DIAGNOSIS — Z86711 Personal history of pulmonary embolism: Secondary | ICD-10-CM | POA: Insufficient documentation

## 2013-05-10 DIAGNOSIS — R079 Chest pain, unspecified: Secondary | ICD-10-CM | POA: Insufficient documentation

## 2013-05-10 DIAGNOSIS — Z79899 Other long term (current) drug therapy: Secondary | ICD-10-CM | POA: Insufficient documentation

## 2013-05-10 DIAGNOSIS — Z7982 Long term (current) use of aspirin: Secondary | ICD-10-CM | POA: Insufficient documentation

## 2013-05-10 DIAGNOSIS — F319 Bipolar disorder, unspecified: Secondary | ICD-10-CM | POA: Insufficient documentation

## 2013-05-10 HISTORY — DX: Heart failure, unspecified: I50.9

## 2013-05-10 LAB — CBC WITH DIFFERENTIAL/PLATELET
Basophils Absolute: 0 10*3/uL (ref 0.0–0.1)
Basophils Relative: 0 % (ref 0–1)
EOS PCT: 6 % — AB (ref 0–5)
Eosinophils Absolute: 0.3 10*3/uL (ref 0.0–0.7)
HCT: 38.6 % — ABNORMAL LOW (ref 39.0–52.0)
HEMOGLOBIN: 13.5 g/dL (ref 13.0–17.0)
LYMPHS ABS: 2 10*3/uL (ref 0.7–4.0)
LYMPHS PCT: 36 % (ref 12–46)
MCH: 31 pg (ref 26.0–34.0)
MCHC: 35 g/dL (ref 30.0–36.0)
MCV: 88.5 fL (ref 78.0–100.0)
MONO ABS: 0.6 10*3/uL (ref 0.1–1.0)
MONOS PCT: 11 % (ref 3–12)
Neutro Abs: 2.7 10*3/uL (ref 1.7–7.7)
Neutrophils Relative %: 48 % (ref 43–77)
Platelets: 260 10*3/uL (ref 150–400)
RBC: 4.36 MIL/uL (ref 4.22–5.81)
RDW: 12.5 % (ref 11.5–15.5)
WBC: 5.6 10*3/uL (ref 4.0–10.5)

## 2013-05-10 LAB — BASIC METABOLIC PANEL
BUN: 15 mg/dL (ref 6–23)
CO2: 28 mEq/L (ref 19–32)
Calcium: 9.1 mg/dL (ref 8.4–10.5)
Chloride: 100 mEq/L (ref 96–112)
Creatinine, Ser: 0.72 mg/dL (ref 0.50–1.35)
GFR calc Af Amer: >90 mL/min (ref >90)
GFR calc non Af Amer: >90 mL/min (ref >90)
Glucose, Bld: 105 mg/dL — ABNORMAL HIGH (ref 70–99)
Potassium: 4.2 mEq/L (ref 3.7–5.3)
SODIUM: 136 meq/L — AB (ref 137–147)

## 2013-05-10 LAB — TROPONIN I
Troponin I: <0.3 ng/mL (ref <0.30)
Troponin I: <0.3 ng/mL (ref <0.30)

## 2013-05-10 NOTE — Discharge Instructions (Signed)
Tests were normal.   Follow up your cardiologist.  Phone number given.  Return if worse.

## 2013-05-10 NOTE — ED Notes (Signed)
Pt reports productive cough and chest pressure since last week.  Unknown if has had fever.  Reports has COPD and  "feels like I'm suffocating."

## 2013-05-10 NOTE — ED Provider Notes (Signed)
CSN: [id number]     Arrival date & time 05/10/13  1157 History  This chart was scribed for Donnetta HutchingBrian Cherity Blickenstaff, MD by Beverly MilchJ Harrison Collins, ED Scribe. This patient was seen in room APA09/[facility]09 and the patient's care was started at 12:20 PM.    Chief Complaint  Patient presents with  . Chest Pain      The history is provided by the spouse and the patient. No language interpreter was used.   HPI Comments: Bradley Day is a 43 y.o. male with a h/o emphysema, CHF, and COPD who presents to the Emergency Department complaining of gradually worsening chest pain that began a week ago. Pt reports the pain feels like pressure on his chest like he's suffocating. Pt reports he is a current smoker. Review of systems positive for dyspnea. No diaphoresis or nausea. No radiation of pain. Severity is mild to moderate.   Past Medical History  Diagnosis Date  . Emphysema   . Depression   . Bipolar 1 disorder   . Pulmonary embolism August 2013    off anticoagulation 12/2011 per pt  . IV drug user     last IV drug use in 2011  . NSTEMI (non-ST elevated myocardial infarction)   . Rhabdomyolysis   . Cardiomyopathy   . CHF (congestive heart failure)     Past Surgical History  Procedure Laterality Date  . Hand surgery      Family History  Problem Relation Age of Onset  . CAD Father   . CAD Maternal Grandmother   . Hypertension Maternal Grandfather   . Diabetes Mother   . Diabetes Maternal Grandfather   . Cancer - Prostate Maternal Grandfather     History  Substance Use Topics  . Smoking status: Current Every Day Smoker -- 1.00 packs/day    Types: Cigarettes  . Smokeless tobacco: Not on file  . Alcohol Use: No     Comment: occasional    Review of Systems  All other systems reviewed and are negative.   A complete 10 system review of systems was obtained and all systems are negative except as noted in the HPI and PMH.     Allergies  Augmentin and Toradol  Home Medications   Current  Outpatient Rx  Name  Route  Sig  Dispense  Refill  . albuterol (PROVENTIL HFA;VENTOLIN HFA) 108 (90 BASE) MCG/ACT inhaler   Inhalation   Inhale 2 puffs into the lungs every 6 (six) hours as needed for wheezing or shortness of breath.         Marland Kitchen. aspirin EC 81 MG tablet   Oral   Take 81 mg by mouth daily.         Marland Kitchen. gabapentin (NEURONTIN) 400 MG capsule   Oral   Take 2 capsules (800 mg total) by mouth 3 (three) times daily.   180 capsule   0   . lisinopril (PRINIVIL,ZESTRIL) 5 MG tablet   Oral   Take 1 tablet (5 mg total) by mouth daily.   90 tablet   3   . methadone (DOLOPHINE) 10 MG/ML solution   Oral   Take 115 mg by mouth every other day.          . metoprolol tartrate (LOPRESSOR) 25 MG tablet   Oral   Take 12.5 mg by mouth 2 (two) times daily.          BP 100/66  Pulse 65  Temp(Src) 97.4 F (36.3 C) (Oral)  Resp 18  Ht 6\' 1"  (1.854 m)  Wt 155 lb (70.308 kg)  BMI 20.45 kg/m2  SpO2 100%  Physical Exam  Nursing note and vitals reviewed. Constitutional: He is oriented to person, place, and time. He appears well-developed and well-nourished.  HENT:  Head: Normocephalic and atraumatic.  Eyes: Conjunctivae and EOM are normal. Pupils are equal, round, and reactive to light.  Neck: Normal range of motion. Neck supple.  Cardiovascular: Normal rate, regular rhythm and normal heart sounds.   Pulmonary/Chest: Effort normal and breath sounds normal.  Abdominal: Soft. Bowel sounds are normal.  Musculoskeletal: Normal range of motion.  Neurological: He is alert and oriented to person, place, and time.  Skin: Skin is warm and dry.  Psychiatric: He has a normal mood and affect. His behavior is normal.    ED Course  Procedures (including critical care time)  DIAGNOSTIC STUDIES: Oxygen Saturation is 100% on RA, normal by my interpretation.    COORDINATION OF CARE: 12:23 PM- Discussed importance of smoking cessation. Will order diagnostic labs. Pt advised of plan  for treatment and pt agrees.   Labs Review Labs Reviewed  BASIC METABOLIC PANEL - Abnormal; Notable for the following:    Sodium 136 (*)    Glucose, Bld 105 (*)    All other components within normal limits  CBC WITH DIFFERENTIAL - Abnormal; Notable for the following:    HCT 38.6 (*)    Eosinophils Relative 6 (*)    All other components within normal limits  TROPONIN I  TROPONIN I   Imaging Review Dg Chest 2 View  05/10/2013   CLINICAL DATA:  Chest pain  EXAM: CHEST  2 VIEW  COMPARISON:  None.  FINDINGS: Heart is normal in size. Lungs are hyper aerated and clear. No pleural effusion. No pneumothorax.  IMPRESSION: No active cardiopulmonary disease.   Electronically Signed   By: Maryclare Bean M.D.   On: 05/10/2013 13:01     EKG Interpretation   Date/Time:  Sunday May 10 2013 12:15:15 EDT Ventricular Rate:  67 PR Interval:  154 QRS Duration: 92 QT Interval:  418 QTC Calculation: 441 R Axis:   72 Text Interpretation:  Normal sinus rhythm Normal ECG When compared with  ECG of 21-Sep-2012 20:52, Vent. rate has decreased BY  63 BPM Criteria for  Inferior infarct are no longer Present Nonspecific T wave abnormality no  longer evident in Inferior leads Confirmed by Adriana Simas  MD, Alphonza ([id number]) on  05/10/2013 12:39:41 PM      MDM   Final diagnoses:  Chest pain   Discussed with Dr Adella Hare.  Cardiac enzymes neg times two.  EKG normal.  Will follow up with cardiology.   Also discussed with Dr Wilkie Aye. I personally performed the services described in this documentation, which was scribed in my presence. The recorded information has been reviewed and is accurate.     Donnetta Hutching, MD 05/10/13 [phone]

## 2014-05-11 IMAGING — CR DG CHEST 2V
2 series · 2 of 2 positions shown · non-contrast
Comparison: None.

CLINICAL DATA: Chest pain

EXAM:
CHEST  2 VIEW

[view not recorded (1 of 2)]
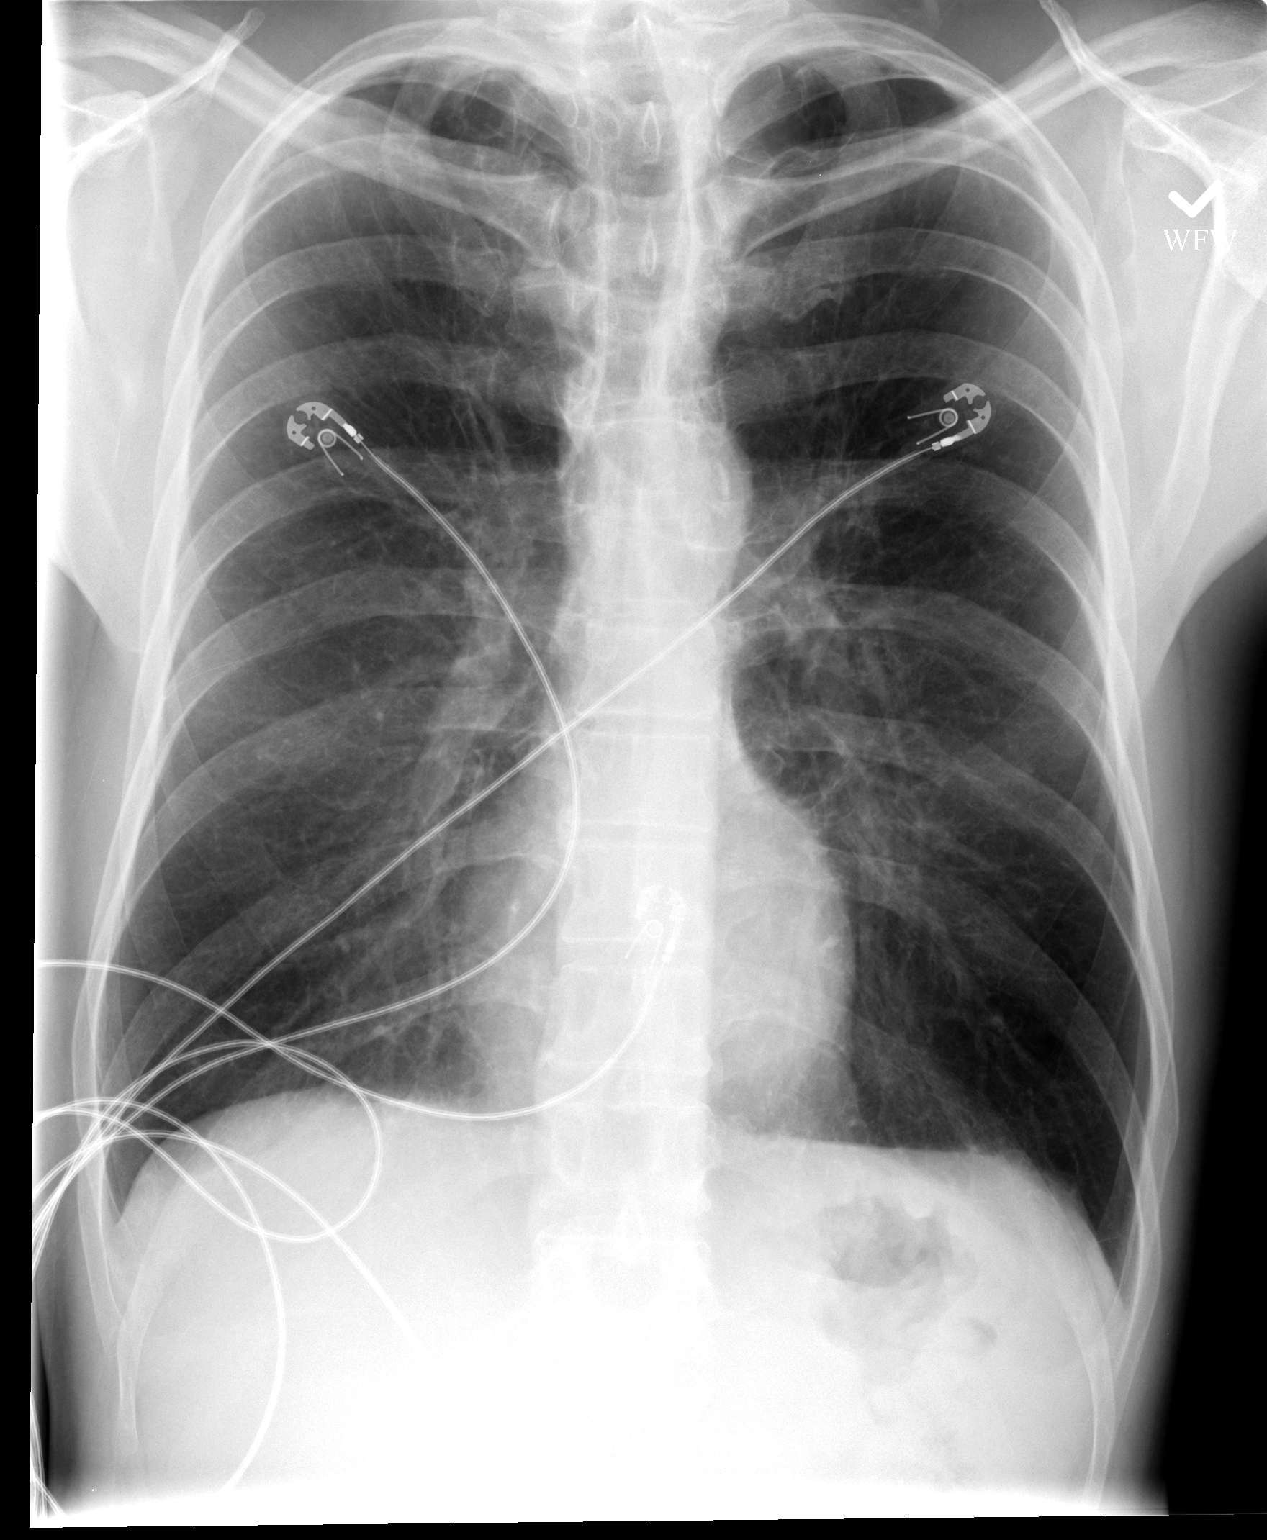

[view not recorded (2 of 2)]
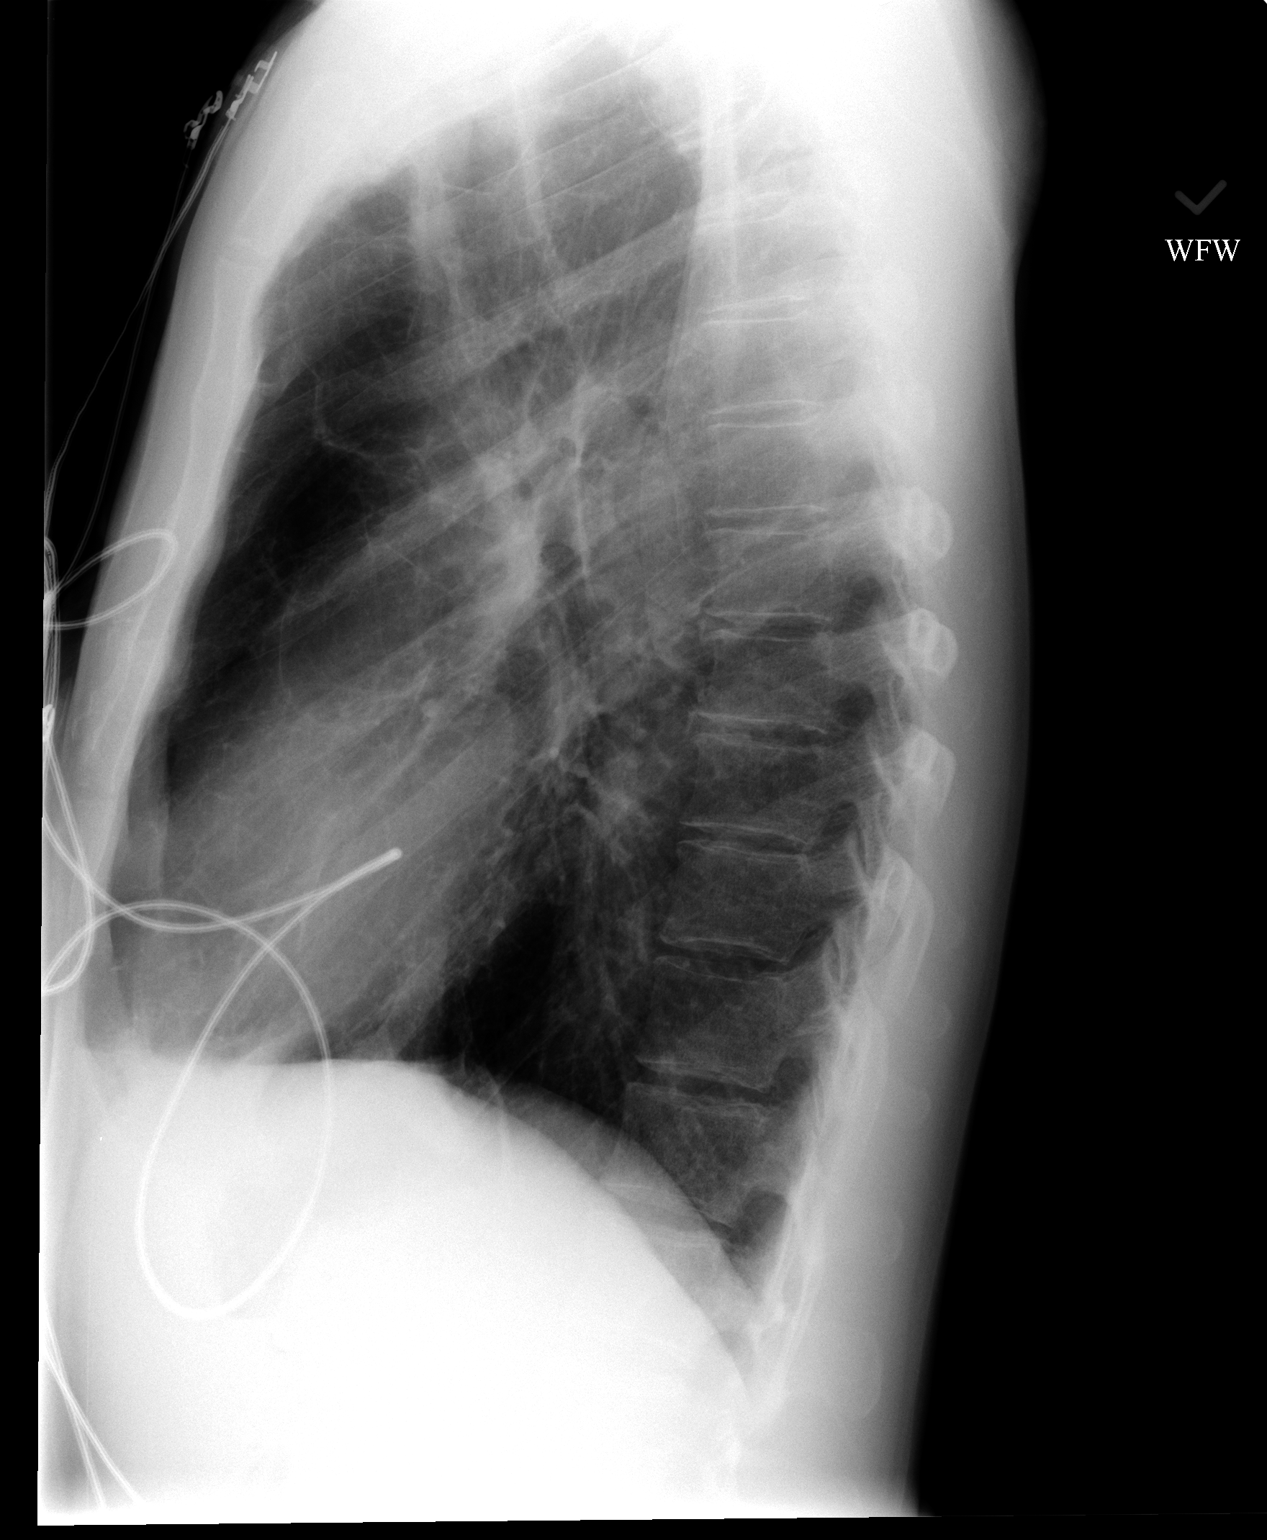

[2 of 2 positions shown; findings below may reference images not displayed]

FINDINGS: Heart is normal in size. Lungs are hyper aerated and clear. No
pleural effusion. No pneumothorax.
IMPRESSION: No active cardiopulmonary disease.

## 2014-08-13 ENCOUNTER — Emergency Department (HOSPITAL_COMMUNITY)
Admission: EM | Admit: 2014-08-13 | Discharge: 2014-08-13 | Disposition: A | Discharge status: Home / Self Care | Payer: Medicaid - Out of State | Attending: Emergency Medicine | Admitting: Emergency Medicine

## 2014-08-13 ENCOUNTER — Encounter (HOSPITAL_COMMUNITY): Payer: Self-pay | Admitting: Emergency Medicine

## 2014-08-13 DIAGNOSIS — R339 Retention of urine, unspecified: Secondary | ICD-10-CM

## 2014-08-13 DIAGNOSIS — Z8739 Personal history of other diseases of the musculoskeletal system and connective tissue: Secondary | ICD-10-CM | POA: Diagnosis not present

## 2014-08-13 DIAGNOSIS — R1030 Lower abdominal pain, unspecified: Secondary | ICD-10-CM | POA: Diagnosis not present

## 2014-08-13 DIAGNOSIS — Z8659 Personal history of other mental and behavioral disorders: Secondary | ICD-10-CM | POA: Diagnosis not present

## 2014-08-13 DIAGNOSIS — I509 Heart failure, unspecified: Secondary | ICD-10-CM | POA: Diagnosis not present

## 2014-08-13 DIAGNOSIS — Z720 Tobacco use: Secondary | ICD-10-CM | POA: Diagnosis not present

## 2014-08-13 DIAGNOSIS — Z79899 Other long term (current) drug therapy: Secondary | ICD-10-CM | POA: Diagnosis not present

## 2014-08-13 DIAGNOSIS — Z86711 Personal history of pulmonary embolism: Secondary | ICD-10-CM | POA: Insufficient documentation

## 2014-08-13 DIAGNOSIS — Z8709 Personal history of other diseases of the respiratory system: Secondary | ICD-10-CM | POA: Diagnosis not present

## 2014-08-13 DIAGNOSIS — R300 Dysuria: Secondary | ICD-10-CM | POA: Diagnosis present

## 2014-08-13 LAB — BASIC METABOLIC PANEL
ANION GAP: 11 (ref 5–15)
BUN: 18 mg/dL (ref 6–20)
CHLORIDE: 99 mmol/L — AB (ref 101–111)
CO2: 24 mmol/L (ref 22–32)
Calcium: 8.9 mg/dL (ref 8.9–10.3)
Creatinine, Ser: 0.99 mg/dL (ref 0.61–1.24)
GFR calc non Af Amer: >60 mL/min (ref >60)
Glucose, Bld: 161 mg/dL — ABNORMAL HIGH (ref 65–99)
POTASSIUM: 3.7 mmol/L (ref 3.5–5.1)
SODIUM: 134 mmol/L — AB (ref 135–145)

## 2014-08-13 LAB — URINALYSIS, ROUTINE W REFLEX MICROSCOPIC
Bilirubin Urine: NEGATIVE
Glucose, UA: NEGATIVE mg/dL
Hgb urine dipstick: NEGATIVE
KETONES UR: NEGATIVE mg/dL
Leukocytes, UA: NEGATIVE
NITRITE: NEGATIVE
PROTEIN: NEGATIVE mg/dL
Specific Gravity, Urine: <1.005 — ABNORMAL LOW (ref 1.005–1.030)
Urobilinogen, UA: 0.2 mg/dL (ref 0.0–1.0)
pH: 6 (ref 5.0–8.0)

## 2014-08-13 LAB — CBC WITH DIFFERENTIAL/PLATELET
Basophils Absolute: 0 10*3/uL (ref 0.0–0.1)
Basophils Relative: 0 % (ref 0–1)
Eosinophils Absolute: 0.3 10*3/uL (ref 0.0–0.7)
Eosinophils Relative: 3 % (ref 0–5)
HCT: 40.5 % (ref 39.0–52.0)
Hemoglobin: 14.1 g/dL (ref 13.0–17.0)
LYMPHS PCT: 30 % (ref 12–46)
Lymphs Abs: 3.2 10*3/uL (ref 0.7–4.0)
MCH: 31.3 pg (ref 26.0–34.0)
MCHC: 34.8 g/dL (ref 30.0–36.0)
MCV: 90 fL (ref 78.0–100.0)
MONOS PCT: 9 % (ref 3–12)
Monocytes Absolute: 0.9 10*3/uL (ref 0.1–1.0)
NEUTROS ABS: 6 10*3/uL (ref 1.7–7.7)
Neutrophils Relative %: 57 % (ref 43–77)
Platelets: 374 10*3/uL (ref 150–400)
RBC: 4.5 MIL/uL (ref 4.22–5.81)
RDW: 12.5 % (ref 11.5–15.5)
WBC: 10.4 10*3/uL (ref 4.0–10.5)

## 2014-08-13 MED ORDER — OXYCODONE-ACETAMINOPHEN 5-325 MG PO TABS
1.0000 | ORAL_TABLET | Freq: Once | ORAL | Status: AC
Start: 2014-08-13 — End: 2014-08-13
  Administered 2014-08-13: 1 via ORAL
  Filled 2014-08-13: qty 1

## 2014-08-13 NOTE — ED Provider Notes (Signed)
CSN: [id number]     Arrival date & time 08/13/14  1607 History   First MD Initiated Contact with Patient 08/13/14 1642     Chief Complaint  Patient presents with  . Dysuria     (Consider location/radiation/quality/duration/timing/severity/associated sxs/prior Treatment) Patient is a 44 y.o. male presenting with abdominal pain. The history is provided by the patient (the pt states he is having difficulty urinating).  Abdominal Pain Pain location:  Suprapubic Pain quality: aching   Pain radiates to:  Does not radiate Pain severity:  Moderate Onset quality:  Gradual Timing:  Constant Progression:  Waxing and waning Associated symptoms: no chest pain, no cough, no diarrhea, no fatigue and no hematuria     Past Medical History  Diagnosis Date  . Emphysema   . Depression   . Bipolar 1 disorder   . Pulmonary embolism August 2013    off anticoagulation 12/2011 per pt  . IV drug user     last IV drug use in 2011  . NSTEMI (non-ST elevated myocardial infarction)   . Rhabdomyolysis   . Cardiomyopathy   . CHF (congestive heart failure)    Past Surgical History  Procedure Laterality Date  . Hand surgery     Family History  Problem Relation Age of Onset  . CAD Father   . CAD Maternal Grandmother   . Hypertension Maternal Grandfather   . Diabetes Mother   . Diabetes Maternal Grandfather   . Cancer - Prostate Maternal Grandfather    History  Substance Use Topics  . Smoking status: Current Every Day Smoker -- 1.00 packs/day    Types: Cigarettes  . Smokeless tobacco: Not on file  . Alcohol Use: No     Comment: occasional    Review of Systems  Constitutional: Negative for appetite change and fatigue.  HENT: Negative for congestion, ear discharge and sinus pressure.   Eyes: Negative for discharge.  Respiratory: Negative for cough.   Cardiovascular: Negative for chest pain.  Gastrointestinal: Positive for abdominal pain. Negative for diarrhea.  Genitourinary: Negative for  frequency and hematuria.  Musculoskeletal: Negative for back pain.  Skin: Negative for rash.  Neurological: Negative for seizures and headaches.  Psychiatric/Behavioral: Negative for hallucinations.      Allergies  Toradol  Home Medications   Prior to Admission medications   Medication Sig Start Date End Date Taking? Authorizing Provider  amitriptyline (ELAVIL) 100 MG tablet Take 100 mg by mouth at bedtime.   Yes Historical Provider, MD  gabapentin (NEURONTIN) 600 MG tablet Take 600 mg by mouth 3 (three) times daily.   Yes Historical Provider, MD  gabapentin (NEURONTIN) 400 MG capsule Take 2 capsules (800 mg total) by mouth 3 (three) times daily. Patient not taking: Reported on 08/13/2014 10/06/12   Renae Fickle, MD  lisinopril (PRINIVIL,ZESTRIL) 5 MG tablet Take 1 tablet (5 mg total) by mouth daily. Patient not taking: Reported on 08/13/2014 12/16/12   Laqueta Linden, MD   BP 114/81 mmHg  Pulse 107  Temp(Src) 98.1 F (36.7 C) (Oral)  Resp 19  Ht 6' (1.829 m)  Wt 150 lb (68.04 kg)  BMI 20.34 kg/m2  SpO2 97% Physical Exam  Constitutional: He is oriented to person, place, and time. He appears well-developed.  HENT:  Head: Normocephalic.  Eyes: Conjunctivae and EOM are normal. No scleral icterus.  Neck: Neck supple. No thyromegaly present.  Cardiovascular: Normal rate and regular rhythm.  Exam reveals no gallop and no friction rub.   No murmur heard. Pulmonary/Chest:  No stridor. He has no wheezes. He has no rales. He exhibits no tenderness.  Abdominal: He exhibits no distension. There is tenderness. There is no rebound.  Mild tender suprapubic  Genitourinary:  Rectal nl.  Prostate nl  Musculoskeletal: Normal range of motion. He exhibits no edema.  Lymphadenopathy:    He has no cervical adenopathy.  Neurological: He is oriented to person, place, and time. He exhibits normal muscle tone. Coordination normal.  Skin: No rash noted. No erythema.  Psychiatric: He has a  normal mood and affect. His behavior is normal.    ED Course  Procedures (including critical care time) Labs Review Labs Reviewed  URINALYSIS, ROUTINE W REFLEX MICROSCOPIC (NOT AT [facility]) - Abnormal; Notable for the following:    Specific Gravity, Urine <1.005 (*)    All other components within normal limits  BASIC METABOLIC PANEL - Abnormal; Notable for the following:    Sodium 134 (*)    Chloride 99 (*)    Glucose, Bld 161 (*)    All other components within normal limits  URINE CULTURE  CBC WITH DIFFERENTIAL/PLATELET    Imaging Review No results found.   EKG Interpretation None      MDM   Final diagnoses:  Urinary retention   Pt had 1000cc of urine when foley placed.  He is on augmentin now.  Pt did not want to keep the foley in.  He will follow up with urology for retention    Bethann Berkshire, MD 08/13/14 1825

## 2014-08-13 NOTE — ED Notes (Signed)
Patient complaining of difficulty urinating. States today he has only used the bathroom one time today. Patient was urinating in bathroom when called for triage.

## 2014-08-13 NOTE — ED Notes (Signed)
Patient is resting comfortably. 

## 2014-08-13 NOTE — ED Notes (Addendum)
EDP reported that pt to be discharged and that pt refused to go home with foley. Discussed importance and function of foley and pt stated "did not want foley to remain." EDP give verbal order to d/c foley.

## 2014-08-13 NOTE — Discharge Instructions (Signed)
Follow up with [facility] in [place] or [place] °

## 2014-08-16 ENCOUNTER — Other Ambulatory Visit: Payer: Self-pay

## 2014-08-16 LAB — URINE CULTURE: CULTURE: NO GROWTH

## 2014-09-29 ENCOUNTER — Ambulatory Visit (INDEPENDENT_AMBULATORY_CARE_PROVIDER_SITE_OTHER): Payer: Medicaid Other | Admitting: Cardiovascular Disease

## 2014-09-29 ENCOUNTER — Encounter: Payer: Self-pay | Admitting: Cardiovascular Disease

## 2014-09-29 VITALS — BP 118/80 | HR 100 | Ht 72.0 in | Wt 171.0 lb

## 2014-09-29 DIAGNOSIS — I429 Cardiomyopathy, unspecified: Secondary | ICD-10-CM

## 2014-09-29 DIAGNOSIS — Z136 Encounter for screening for cardiovascular disorders: Secondary | ICD-10-CM | POA: Diagnosis not present

## 2014-09-29 DIAGNOSIS — I428 Other cardiomyopathies: Secondary | ICD-10-CM

## 2014-09-29 DIAGNOSIS — Z713 Dietary counseling and surveillance: Secondary | ICD-10-CM

## 2014-09-29 DIAGNOSIS — Z91199 Patient's noncompliance with other medical treatment and regimen due to unspecified reason: Secondary | ICD-10-CM

## 2014-09-29 DIAGNOSIS — Z9119 Patient's noncompliance with other medical treatment and regimen: Secondary | ICD-10-CM

## 2014-09-29 DIAGNOSIS — R000 Tachycardia, unspecified: Secondary | ICD-10-CM | POA: Diagnosis not present

## 2014-09-29 DIAGNOSIS — K219 Gastro-esophageal reflux disease without esophagitis: Secondary | ICD-10-CM

## 2014-09-29 DIAGNOSIS — M7989 Other specified soft tissue disorders: Secondary | ICD-10-CM

## 2014-09-29 MED ORDER — METOPROLOL TARTRATE 25 MG PO TABS: 12.5000 mg | ORAL_TABLET | Freq: Two times a day (BID) | ORAL | Status: DC

## 2014-09-29 MED ORDER — OMEPRAZOLE 20 MG PO CPDR: 20.0000 mg | DELAYED_RELEASE_CAPSULE | Freq: Every day | ORAL | Status: DC

## 2014-09-29 NOTE — Progress Notes (Signed)
Patient ID: Bradley Day, male   DOB: 16-Oct-1970, 44 y.o.   MRN: [id number]      SUBJECTIVE: The patient presents for routine follow up for a nonischemic cardiomyopathy. I last saw him in 12/2012 and he was supposed to have followed up 6 months afterwards.   He was hospitalized in 09/2012 for rhabdomyolysis and acute renal failure. He had elevated cardiac enzymes as well with normal ECG. Subsequent echocardiogram demonstrated an ejection fraction of 45-50% with grade II diastolic dysfunction. He had a Lexiscan study on 09-24-12 which showed normal perfusion and an ejection fraction of 37%. He was started on metoprolol. Patient's renal function improved. He was discharged home. He was readmitted on 10-03-12 with acute withdrawal from narcotics. He was referred to a methadone clinic.  He was started on lisinopril 2.5 mg daily and beta blockers could not be started due to a relatively low BP  I previously tried to assist him with obtaining a PCP and made a referral to a pain clinic.  He still does not have a PCP. He has bad heartburn for the past few months. He drinks caffeinated beverages.  He thinks he broke his left foot when he kicked a table last week, and plans on getting it checked out in [place], [place]. His foot was markedly swollen earlier, but has subsided to some degree.  He does complain of bilateral feet and leg swelling at times and questions whether it's related to his heart. Denies orthopnea and paroxysmal nocturnal dyspnea. Says his heart rate is always fast.  ECG shows sinus tachycardia, HR 104 bpm.   Review of Systems: As per "subjective", otherwise negative.  Allergies  Allergen Reactions  . Toradol [Ketorolac Tromethamine]     Severe headaches     Current Outpatient Prescriptions  Medication Sig Dispense Refill  . amitriptyline (ELAVIL) 100 MG tablet Take 100 mg by mouth at bedtime.    . gabapentin (NEURONTIN) 600 MG tablet Take 600 mg by mouth 3 (three) times daily.       No current facility-administered medications for this visit.    Past Medical History  Diagnosis Date  . Emphysema   . Depression   . Bipolar 1 disorder   . Pulmonary embolism August 2013    off anticoagulation 12/2011 per pt  . IV drug user     last IV drug use in 2011  . NSTEMI (non-ST elevated myocardial infarction)   . Rhabdomyolysis   . Cardiomyopathy   . CHF (congestive heart failure)     Past Surgical History  Procedure Laterality Date  . Hand surgery      Social History   Social History  . Marital Status: Legally Separated    Spouse Name: N/A  . Number of Children: N/A  . Years of Education: N/A   Occupational History  . Not on file.   Social History Main Topics  . Smoking status: Current Every Day Smoker -- 1.00 packs/day    Types: Cigarettes    Start date: 09/28/1984  . Smokeless tobacco: Not on file  . Alcohol Use: No     Comment: occasional  . Drug Use: No     Comment: crack  . Sexual Activity: Not on file   Other Topics Concern  . Not on file   Social History Narrative     Filed Vitals:   09/29/14 1311  BP: 118/80  Pulse: 100  Height: 6' (1.829 m)  Weight: 171 lb (77.565 kg)  SpO2: 96%  PHYSICAL EXAM General: NAD HEENT: Normal. Neck: No JVD, no thyromegaly. Lungs: Clear to auscultation bilaterally with normal respiratory effort. CV: Tachycardic, regular rhythm, normal S1/S2, no S3/S4, no murmur. No pretibial or periankle edema.    Abdomen: Soft, nontender, no distention.  Neurologic: Alert and oriented x 3.  Psych: Normal affect. Skin: Normal. Musculoskeletal: Left dorsal foot edema. Extremities: No clubbing or cyanosis.   ECG: Most recent ECG reviewed.      ASSESSMENT AND PLAN: 1. Bilateral leg/feet swelling in context of nonischemic cardiomyopathy: EF 45-50% on 09/24/12. I previously started metoprolol 12.5 mg bid and increase lisinopril to 5 mg daily back in 01/2013, but he is taking neither of them. I will prescribe  metoprolol 12.5 mg bid again, as he is tachycardic. I also told him to avoid consuming high quantities of caffeinated beverages. I will also obtain an echocardiogram to assess for interval changes in LV systolic function. No diuretic requirement at present.   2. GERD: Will prescribe omeprazole 20 mg daily.  Dispo: f/u 6 months.  Prentice Docker, M.D., F.A.C.C.

## 2014-09-29 NOTE — Patient Instructions (Signed)
Your physician wants you to follow-up in:  6 months with Dr Reggy Eye will receive a reminder letter in the mail two months in advance. If you don't receive a letter, please call our office to schedule the follow-up appointment.   Your physician has requested that you have an echocardiogram. Echocardiography is a painless test that uses sound waves to create images of your heart. It provides your doctor with information about the size and shape of your heart and how well your heart's chambers and valves are working. This procedure takes approximately one hour. There are no restrictions for this procedure.     START Omeprazole 20 mg daily for GERD   START Metoprolol 12.5 mg twice a day     Thank you for choosing [facility] !

## 2014-10-18 ENCOUNTER — Ambulatory Visit (HOSPITAL_COMMUNITY): Payer: Medicaid Other | Attending: Cardiovascular Disease

## 2015-04-25 ENCOUNTER — Telehealth: Payer: Self-pay | Admitting: Cardiovascular Disease

## 2015-04-25 NOTE — Telephone Encounter (Signed)
Made pt app for Thurs @ 330 for surgical clearance

## 2015-04-25 NOTE — Telephone Encounter (Signed)
Callie from Dr. Yetta Numbers office in [place] called and left a message stating the pt is needing to have multiple teeth pulled, they are needing cardiac clearance for this pt and also need to know if he's had any testing done. He will be having this procedure at [facility] can be reached at telephone # [phone]

## 2015-04-28 ENCOUNTER — Encounter: Payer: Self-pay | Admitting: Adult Health

## 2015-04-28 ENCOUNTER — Ambulatory Visit (INDEPENDENT_AMBULATORY_CARE_PROVIDER_SITE_OTHER): Payer: Medicaid Other | Admitting: Adult Health

## 2015-04-28 VITALS — BP 124/76 | HR 96 | Ht 73.0 in | Wt 181.0 lb

## 2015-04-28 DIAGNOSIS — I429 Cardiomyopathy, unspecified: Secondary | ICD-10-CM | POA: Diagnosis not present

## 2015-04-28 DIAGNOSIS — R000 Tachycardia, unspecified: Secondary | ICD-10-CM | POA: Diagnosis not present

## 2015-04-28 DIAGNOSIS — Z720 Tobacco use: Secondary | ICD-10-CM

## 2015-04-28 DIAGNOSIS — I428 Other cardiomyopathies: Secondary | ICD-10-CM

## 2015-04-28 MED ORDER — METOPROLOL TARTRATE 25 MG PO TABS: 12.5000 mg | ORAL_TABLET | Freq: Two times a day (BID) | ORAL | Status: DC

## 2015-04-28 NOTE — Progress Notes (Signed)
Cardiology Office Note   Date:  04/28/2015   ID:  LAURIS SERVISS, DOB 1970-12-30, MRN [id number]  PCP:  No PCP Per Patient  Cardiologist: Inis Sizer, NP   No chief complaint on file.     History of Present Illness: Bradley Day is a 45 y.o. male who presents for ongoing assessment and management of nonischemic cardiomyopathy, he was last seen in the office by Dr.Koneswaran in August of 2016, his chronic pain.on last office visit the patient was complaining of lower extremity edema.  Most recent echocardiogram revealed an EF of 40%  Medications were adjusted, but he stopped taking them.  He was restarted on metoprolol 12.5 mg twice a day, counseled on decreasing caffeine in his diet.  He was ordered an echocardiogram.  He was to followup in 6 months.  Echocardiogram was not completed at the time of this.  Followup office visit.  Today having not taken his medicine, nor has he come in for an extra testing as ordered.  He states that the beta blocker gives him issues with a rectal dysfunction.  He refuses to take it.  However, he states his heart rate is staying fast and palpating along.  He continues to smoke heavily greater than a pack a day.  He is dominant chimeric care provider, Avel Sensor, FNP, with [facility]". He states that he needs to have some teeth pulled, and would like to be cleared from a cardiology standpoint to move forward with this.  Past Medical History  Diagnosis Date  . Emphysema   . Depression   . Bipolar 1 disorder   . Pulmonary embolism August 2013    off anticoagulation 12/2011 per pt  . IV drug user     last IV drug use in 2011  . NSTEMI (non-ST elevated myocardial infarction)   . Rhabdomyolysis   . Cardiomyopathy   . CHF (congestive heart failure)     Past Surgical History  Procedure Laterality Date  . Hand surgery       Current Outpatient Prescriptions  Medication Sig Dispense Refill  . amitriptyline (ELAVIL) 100 MG tablet Take  100 mg by mouth at bedtime.    . gabapentin (NEURONTIN) 600 MG tablet Take 600 mg by mouth 3 (three) times daily.    . metoprolol tartrate (LOPRESSOR) 25 MG tablet Take 0.5 tablets (12.5 mg total) by mouth 2 (two) times daily. 90 tablet 3  . omeprazole (PRILOSEC) 20 MG capsule Take 1 capsule (20 mg total) by mouth daily. 30 capsule 11   No current facility-administered medications for this visit.    Allergies:   Toradol    Social History:  The patient  reports that he has been smoking Cigarettes.  He started smoking about 30 years ago. He has been smoking about 1.00 pack per day. He does not have any smokeless tobacco history on file. He reports that he does not drink alcohol or use illicit drugs.   Family History:  The patient's family history includes CAD in his father and maternal grandmother; Cancer - Prostate in his maternal grandfather; Diabetes in his maternal grandfather and mother; Hypertension in his maternal grandfather.    ROS: All other systems are reviewed and negative. Unless otherwise mentioned in H&P    PHYSICAL EXAM: VS:  There were no vitals taken for this visit. , BMI There is no weight on file to calculate BMI. GEN: Well nourished, well developed, in no acute distress HEENT: normal Neck: no JVD, carotid  bruits, or masses Cardiac: RRR; tachycardia no murmurs, rubs, or gallops,no edema  Respiratory:  clear to auscultation bilaterally, normal work of breathing GI: soft, nontender, nondistended, + BS MS: no deformity or atrophySEveral missing fingers on his left hand 1st-3rd Skin: warm and dry, no rash Neuro:  Strength and sensation are intact Psych: euthymic mood, full affect  Recent Labs: 08/13/2014: BUN 18; Creatinine, Ser 0.99; Hemoglobin 14.1; Platelets 374; Potassium 3.7; Sodium 134*    Lipid Panel No results found for: CHOL, TRIG, HDL, CHOLHDL, VLDL, LDLCALC, LDLDIRECT    Wt Readings from Last 3 Encounters:  09/29/14 171 lb (77.565 kg)  08/13/14 150 lb  (68.04 kg)  05/10/13 155 lb (70.308 kg)      Other studies Reviewed: Additional studies/ records that were reviewed today include: echocardiogram Review of the above records demonstrates: this was dated 03/02/2013, with an EF of 40-45%, grade 2 diastolic dysfunction.   ASSESSMENT AND PLAN:  1.  Nonischemic cardiomyopathy: I have again talked with him about medical compliance.  He will go back on the Toprol 12.5 mg twice a day.  I will order the echocardiogram as previously requested.  Concerning until extractions, these provide a low risk, it would probably not be an issue unless he had to undergo significant anesthesia. He will followup in the [facility] office, with Dr. Purvis Sheffield, to discuss his response to metoprolol and echo results.  2. Tachycardia: likely related to nicotine abuse, but other psychological issues may be playing a part.  He is also in chronic pain.  I convinced him to go back on metoprolol 12.5 mg twice a day.  He will close followup to evaluate his response.  3. Ongoing tobacco abuse:I counseled this patient greater than 5 minutes concerning this.  He states he wants to quit and he and his girlfriend have patches, but they are not ready to begin.  4. History of medical noncompliance:he is given counseling concerning this.  If he continues in this manner, as has been previously documented over the last 2 office visits, we may need to discuss releasing him from our practice.  Will defer to Dr. Purvis Sheffield to make that decision,should he return.   Current medicines are reviewed at length with the patient today.    Labs/ tests ordered today include: echocardiogram No orders of the defined types were placed in this encounter.     Disposition:   FU with one month in [place]  Signed, Joni Reining, NP  04/28/2015 7:16 AM    [facility] [address] [place], [place] [address] Phone: [phone]; Fax: [phone]

## 2015-04-28 NOTE — Patient Instructions (Signed)
Your physician recommends that you schedule a follow-up appointment in: 1 Month in [place] with Dr. Purvis Sheffield  Your physician has recommended you make the following change in your medication:   Restart Metoprolol Tartrate 12.5 mg Two Times Daily  If you need a refill on your cardiac medications before your next appointment, please call your pharmacy.  Thank you for choosing [facility]!

## 2015-04-28 NOTE — Progress Notes (Signed)
Name: Bradley Day    DOB: Jan 07, 1971  Age: 45 y.o.  MR#: [id number]       PCP:  No PCP Per Patient      Insurance: Payor: MEDICAID ADVANTAGE OUT OF STATE / Plan: BCBS MEDICAID ADVANTAGE / Product Type: *No Product type* /   CC:   No chief complaint on file.   VS Filed Vitals:   04/28/15 1505  BP: 124/76  Pulse: 96  Height:  (1.854 m)  Weight: 181 lb (82.101 kg)  SpO2: 97%    Weights Current Weight  04/28/15 181 lb (82.101 kg)  09/29/14 171 lb (77.565 kg)  08/13/14 150 lb (68.04 kg)    Blood Pressure  BP Readings from Last 3 Encounters:  04/28/15 124/76  09/29/14 118/80  08/13/14 113/81     Admit date:  (Not on file) Last encounter with RMR:  Visit date not found   Allergy Toradol  Current Outpatient Prescriptions  Medication Sig Dispense Refill  . cholecalciferol (VITAMIN D) 1000 units tablet Take 1,000 Units by mouth daily.    Marland Kitchen amitriptyline (ELAVIL) 100 MG tablet Take 100 mg by mouth at bedtime.    . gabapentin (NEURONTIN) 600 MG tablet Take 600 mg by mouth 3 (three) times daily.    Marland Kitchen omeprazole (PRILOSEC) 20 MG capsule Take 1 capsule (20 mg total) by mouth daily. (Patient not taking: Reported on 04/28/2015) 30 capsule 11   No current facility-administered medications for this visit.    Discontinued Meds:    Medications Discontinued During This Encounter  Medication Reason  . metoprolol tartrate (LOPRESSOR) 25 MG tablet Error    Patient Active Problem List   Diagnosis Date Noted  . Protein-calorie malnutrition, severe (HCC) 10/05/2012  . 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 Opioid withdrawal (HCC) 10/03/2012  . Weakness of left leg 10/03/2012  . Nonischemic cardiomyopathy (HCC) 09/25/2012  . Rhabdomyolysis 09/21/2012  . AKI (acute kidney injury) (HCC) 09/21/2012  . NSTEMI (non-ST elevated myocardial infarction) (HCC) 09/21/2012  . Urinary retention 09/21/2012  . Drug overdose, multiple drugs 09/21/2012  . Transaminitis 09/21/2012  . Hypokalemia 09/21/2012  .  Diabetes mellitus (HCC) 09/21/2012  . Chronic back pain 09/21/2012    LABS    Component Value Date/Time   NA 134* 08/13/2014 1701   NA 136* 05/10/2013 1308   NA 139 03/23/2013 1843   K 3.7 08/13/2014 1701   K 4.2 05/10/2013 1308   K 3.8 03/23/2013 1843   CL 99* 08/13/2014 1701   CL 100 05/10/2013 1308   CL 97 03/23/2013 1843   CO2 24 08/13/2014 1701   CO2 28 05/10/2013 1308   CO2 30 03/23/2013 1843   GLUCOSE 161* 08/13/2014 1701   GLUCOSE 105* 05/10/2013 1308   GLUCOSE 95 03/23/2013 1843   BUN 18 08/13/2014 1701   BUN 15 05/10/2013 1308   BUN 13 03/23/2013 1843   CREATININE 0.99 08/13/2014 1701   CREATININE 0.72 05/10/2013 1308   CREATININE 0.81 03/23/2013 1843   CALCIUM 8.9 08/13/2014 1701   CALCIUM 9.1 05/10/2013 1308   CALCIUM 9.7 03/23/2013 1843   GFRNONAA >60 08/13/2014 1701   GFRNONAA >90 05/10/2013 1308   GFRNONAA >90 03/23/2013 1843   GFRAA >60 08/13/2014 1701   GFRAA >90 05/10/2013 1308   GFRAA >90 03/23/2013 1843   CMP     Component Value Date/Time   NA 134* 08/13/2014 1701   K 3.7 08/13/2014 1701   CL 99* 08/13/2014 1701   CO2 24 08/13/2014 1701  GLUCOSE 161* 08/13/2014 1701   BUN 18 08/13/2014 1701   CREATININE 0.99 08/13/2014 1701   CALCIUM 8.9 08/13/2014 1701   PROT 7.9 03/23/2013 1843   ALBUMIN 3.9 03/23/2013 1843   AST 18 03/23/2013 1843   ALT 21 03/23/2013 1843   ALKPHOS 107 03/23/2013 1843   BILITOT 0.3 03/23/2013 1843   GFRNONAA >60 08/13/2014 1701   GFRAA >60 08/13/2014 1701       Component Value Date/Time   WBC 10.4 08/13/2014 1701   WBC 5.6 05/10/2013 1308   WBC 10.5 03/23/2013 1843   HGB 14.1 08/13/2014 1701   HGB 13.5 05/10/2013 1308   HGB 14.4 03/23/2013 1843   HCT 40.5 08/13/2014 1701   HCT 38.6* 05/10/2013 1308   HCT 40.4 03/23/2013 1843   MCV 90.0 08/13/2014 1701   MCV 88.5 05/10/2013 1308   MCV 88.2 03/23/2013 1843    Lipid Panel  No results found for: CHOL, TRIG, HDL, CHOLHDL, VLDL, LDLCALC, LDLDIRECT  ABG     Component Value Date/Time   PHART 7.428 09/21/2012 2154   PCO2ART 46.1* 09/21/2012 2154   PO2ART 55.6* 09/21/2012 2154   HCO3 29.9* 09/21/2012 2154   O2SAT 89.0 09/21/2012 2154     No results found for: TSH BNP (last 3 results) No results for input(s): BNP in the last 8760 hours.  ProBNP (last 3 results) No results for input(s): PROBNP in the last 8760 hours.  Cardiac Panel (last 3 results) No results for input(s): CKTOTAL, CKMB, TROPONINI, RELINDX in the last 72 hours.  Iron/TIBC/Ferritin/ %Sat No results found for: IRON, TIBC, FERRITIN, IRONPCTSAT   EKG Orders placed or performed in visit on 09/29/14  . EKG 12-Lead     Prior Assessment and Plan Problem List as of 04/28/2015      Cardiovascular and Mediastinum   NSTEMI (non-ST elevated myocardial infarction) (HCC)   Nonischemic cardiomyopathy (HCC)     Endocrine   Diabetes mellitus (HCC)     Nervous and Auditory   Opioid withdrawal (HCC)   Weakness of left leg     Musculoskeletal and Integument   Rhabdomyolysis   Myositis     Genitourinary   AKI (acute kidney injury) (HCC)   Urinary retention     Other   Drug overdose, multiple drugs   Transaminitis   Hypokalemia   Chronic back pain   Protein-calorie malnutrition, severe (HCC)       Imaging: No results found.

## 2015-05-03 ENCOUNTER — Ambulatory Visit (HOSPITAL_COMMUNITY)
Admission: RE | Admit: 2015-05-03 | Discharge: 2015-05-03 | Disposition: A | Discharge status: Home / Self Care | Payer: Medicaid Other | Source: Ambulatory Visit | Attending: Adult Health | Admitting: Adult Health

## 2015-05-03 DIAGNOSIS — I10 Essential (primary) hypertension: Secondary | ICD-10-CM | POA: Insufficient documentation

## 2015-05-03 DIAGNOSIS — I428 Other cardiomyopathies: Secondary | ICD-10-CM

## 2015-05-03 DIAGNOSIS — E119 Type 2 diabetes mellitus without complications: Secondary | ICD-10-CM | POA: Insufficient documentation

## 2015-05-03 DIAGNOSIS — I429 Cardiomyopathy, unspecified: Secondary | ICD-10-CM | POA: Insufficient documentation

## 2015-05-03 DIAGNOSIS — I252 Old myocardial infarction: Secondary | ICD-10-CM | POA: Diagnosis not present

## 2015-05-17 ENCOUNTER — Telehealth: Payer: Self-pay | Admitting: Cardiovascular Disease

## 2015-05-17 NOTE — Telephone Encounter (Signed)
Clearance completed by Dr. Purvis Sheffield. Clearance and notes faxed to Dr.Manring office.

## 2015-05-17 NOTE — Telephone Encounter (Signed)
Pt is scheduled for surgery to have 8 teeth removed and Dr. Army Melia office is calling for surgical clearance, pls call Clydie Braun @ [phone]

## 2015-05-31 ENCOUNTER — Encounter: Payer: Self-pay | Admitting: Cardiovascular Disease

## 2015-05-31 ENCOUNTER — Ambulatory Visit (INDEPENDENT_AMBULATORY_CARE_PROVIDER_SITE_OTHER): Payer: Medicaid Other | Admitting: Cardiovascular Disease

## 2015-05-31 VITALS — BP 103/70 | HR 83 | Ht 72.0 in | Wt 184.0 lb

## 2015-05-31 DIAGNOSIS — Z91199 Patient's noncompliance with other medical treatment and regimen due to unspecified reason: Secondary | ICD-10-CM

## 2015-05-31 DIAGNOSIS — I5022 Chronic systolic (congestive) heart failure: Secondary | ICD-10-CM

## 2015-05-31 DIAGNOSIS — Z9119 Patient's noncompliance with other medical treatment and regimen: Secondary | ICD-10-CM

## 2015-05-31 DIAGNOSIS — I429 Cardiomyopathy, unspecified: Secondary | ICD-10-CM

## 2015-05-31 DIAGNOSIS — R000 Tachycardia, unspecified: Secondary | ICD-10-CM | POA: Diagnosis not present

## 2015-05-31 DIAGNOSIS — I428 Other cardiomyopathies: Secondary | ICD-10-CM

## 2015-05-31 NOTE — Progress Notes (Signed)
Patient ID: Bradley Day, male   DOB: 11-30-1970, 45 y.o.   MRN: [id number]      SUBJECTIVE: The patient returns for follow-up of nonischemic cardiomyopathy. Echocardiogram on 05/03/15 showed mild left ventricular dilatation, EF 40% with diffuse hypokinesis, and left ventricular apical false tendon. He is typically noncompliant with medical therapy. He has a long history of tobacco abuse.  Denies chest pain and shortness of breath. Recently underwent surgery for detached retina.  Review of Systems: As per "subjective", otherwise negative.  Allergies  Allergen Reactions  . Toradol [Ketorolac Tromethamine]     Severe headaches     Current Outpatient Prescriptions  Medication Sig Dispense Refill  . amitriptyline (ELAVIL) 100 MG tablet Take 100 mg by mouth at bedtime.    . cholecalciferol (VITAMIN D) 1000 units tablet Take 1,000 Units by mouth daily.    . dorzolamide-timolol (COSOPT) 22.3-6.8 MG/ML ophthalmic solution Place 2 drops into the left eye 2 (two) times daily.    Marland Kitchen erythromycin ophthalmic ointment Place 1 application into the left eye 2 (two) times daily.    Marland Kitchen FLUoxetine (PROZAC) 20 MG capsule Take 1 capsule by mouth daily.    Marland Kitchen gabapentin (NEURONTIN) 600 MG tablet Take 600 mg by mouth 3 (three) times daily.    . metoprolol tartrate (LOPRESSOR) 25 MG tablet Take 0.5 tablets (12.5 mg total) by mouth 2 (two) times daily. 90 tablet 3  . omeprazole (PRILOSEC) 20 MG capsule Take 1 capsule (20 mg total) by mouth daily. 30 capsule 11   No current facility-administered medications for this visit.    Past Medical History  Diagnosis Date  . Emphysema   . Depression   . Bipolar 1 disorder (HCC)   . Pulmonary embolism [facility]) August 2013    off anticoagulation 12/2011 per pt  . IV drug user     last IV drug use in 2011  . NSTEMI (non-ST elevated myocardial infarction) (HCC)   . Rhabdomyolysis   . Cardiomyopathy (HCC)   . CHF (congestive heart failure) [facility])     Past Surgical  History  Procedure Laterality Date  . Hand surgery      Social History   Social History  . Marital Status: Legally Separated    Spouse Name: N/A  . Number of Children: N/A  . Years of Education: N/A   Occupational History  . Not on file.   Social History Main Topics  . Smoking status: Current Every Day Smoker -- 1.00 packs/day    Types: Cigarettes    Start date: 09/28/1984  . Smokeless tobacco: Never Used  . Alcohol Use: No     Comment: occasional  . Drug Use: No     Comment: crack  . Sexual Activity: Not on file   Other Topics Concern  . Not on file   Social History Narrative     Filed Vitals:   05/31/15 1109  BP: 103/70  Pulse: 83  Height: 6' (1.829 m)  Weight: 184 lb (83.462 kg)    PHYSICAL EXAM General: NAD HEENT: Normal. Neck: No JVD, no thyromegaly. Lungs: Clear to auscultation bilaterally with normal respiratory effort. CV: Nondisplaced PMI.  Regular rate and rhythm, normal S1/S2, no S3/S4, no murmur. No pretibial or periankle edema.     Abdomen: Soft, nontender, no distention.  Neurologic: Alert and oriented.  Psych: Normal affect. Skin: Normal. Musculoskeletal: No gross deformities.  ECG: Most recent ECG reviewed.      ASSESSMENT AND PLAN: 1. Chronic systolic heart failure/nonischemic cardiomyopathy,  EF 40%: Symptomatically stable. He is now back to taking metoprolol after discontinuing several times. No ICD indication.  2. Medication noncompliance: He is now back to taking metoprolol after discontinuing several times. Encouraged continued use.  3. Tachycardia: Resolved with adherence to metoprolol.  Dispo: fu 1 year.  Prentice Docker, M.D., F.A.C.C.

## 2015-05-31 NOTE — Patient Instructions (Signed)
Continue all current medications. Your physician wants you to follow up in:  1 year.  You will receive a reminder letter in the mail one-two months in advance.  If you don't receive a letter, please call our office to schedule the follow up appointment   

## 2015-09-21 ENCOUNTER — Encounter (HOSPITAL_COMMUNITY): Payer: Self-pay | Admitting: Emergency Medicine

## 2015-09-21 ENCOUNTER — Emergency Department (HOSPITAL_COMMUNITY)
Admission: EM | Admit: 2015-09-21 | Discharge: 2015-09-21 | Disposition: A | Discharge status: Home / Self Care | Payer: Medicaid Other | Attending: Emergency Medicine | Admitting: Emergency Medicine

## 2015-09-21 DIAGNOSIS — Z79899 Other long term (current) drug therapy: Secondary | ICD-10-CM | POA: Diagnosis not present

## 2015-09-21 DIAGNOSIS — R490 Dysphonia: Secondary | ICD-10-CM | POA: Diagnosis not present

## 2015-09-21 DIAGNOSIS — F319 Bipolar disorder, unspecified: Secondary | ICD-10-CM | POA: Diagnosis not present

## 2015-09-21 DIAGNOSIS — Z720 Tobacco use: Secondary | ICD-10-CM

## 2015-09-21 DIAGNOSIS — E119 Type 2 diabetes mellitus without complications: Secondary | ICD-10-CM | POA: Insufficient documentation

## 2015-09-21 DIAGNOSIS — I509 Heart failure, unspecified: Secondary | ICD-10-CM | POA: Insufficient documentation

## 2015-09-21 NOTE — ED Triage Notes (Addendum)
Pt reports intermittent cough and congestion several weeks ago and is currently being treated for sinusitis. Pt also reports horse voice x1 month. nad noted.

## 2015-09-21 NOTE — ED Provider Notes (Signed)
[facility]-EMERGENCY DEPT Provider Note   CSN: [id number] Arrival date & time: 09/21/15  1100  First Provider Contact:  First MD Initiated Contact with Patient 09/21/15 1114        History   Chief Complaint Chief Complaint  Patient presents with  . Hoarse    HPI Bradley Day is a 45 y.o. male.  He presents for evaluation of hoarseness for several weeks, despite being treated for sinusitis with amoxicillin and prednisone, day 4 of each. He denies fever, chills, cough, shortness of breath, weakness or dizziness. Continues to smoke cigarettes. No prior chronic laryngeal disorder. History of tracheal intubation, following multiple trauma. There are no other known modifying factors.   HPI  Past Medical History:  Diagnosis Date  . Bipolar 1 disorder (HCC)   . Cardiomyopathy (HCC)   . CHF (congestive heart failure) (HCC)   . Depression   . Emphysema   . IV drug user    last IV drug use in 2011  . NSTEMI (non-ST elevated myocardial infarction) (HCC)   . Pulmonary embolism [facility]) August 2013   off anticoagulation 12/2011 per pt  . Rhabdomyolysis     Patient Active Problem List   Diagnosis Date Noted  . Protein-calorie malnutrition, severe (HCC) 10/05/2012  . 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 Opioid withdrawal (HCC) 10/03/2012  . Weakness of left leg 10/03/2012  . Nonischemic cardiomyopathy (HCC) 09/25/2012  . Rhabdomyolysis 09/21/2012  . AKI (acute kidney injury) (HCC) 09/21/2012  . NSTEMI (non-ST elevated myocardial infarction) (HCC) 09/21/2012  . Urinary retention 09/21/2012  . Drug overdose, multiple drugs 09/21/2012  . Transaminitis 09/21/2012  . Hypokalemia 09/21/2012  . Diabetes mellitus (HCC) 09/21/2012  . Chronic back pain 09/21/2012    Past Surgical History:  Procedure Laterality Date  . HAND SURGERY         Home Medications    Prior to Admission medications   Medication Sig Start Date End Date Taking? Authorizing Provider  amitriptyline (ELAVIL) 100 MG tablet  Take 100 mg by mouth at bedtime.    Historical Provider, MD  cholecalciferol (VITAMIN D) 1000 units tablet Take 1,000 Units by mouth daily.    Historical Provider, MD  dorzolamide-timolol (COSOPT) 22.3-6.8 MG/ML ophthalmic solution Place 2 drops into the left eye 2 (two) times daily. 05/14/15   Historical Provider, MD  erythromycin ophthalmic ointment Place 1 application into the left eye 2 (two) times daily. 05/14/15   Historical Provider, MD  FLUoxetine (PROZAC) 20 MG capsule Take 1 capsule by mouth daily. 05/09/15   Historical Provider, MD  gabapentin (NEURONTIN) 600 MG tablet Take 600 mg by mouth 3 (three) times daily.    Historical Provider, MD  metoprolol tartrate (LOPRESSOR) 25 MG tablet Take 0.5 tablets (12.5 mg total) by mouth 2 (two) times daily. 04/28/15   Jodelle Gross, NP  omeprazole (PRILOSEC) 20 MG capsule Take 1 capsule (20 mg total) by mouth daily. 09/29/14   Laqueta Linden, MD    Family History Family History  Problem Relation Age of Onset  . CAD Father   . CAD Maternal Grandmother   . Hypertension Maternal Grandfather   . Diabetes Maternal Grandfather   . Cancer - Prostate Maternal Grandfather   . Diabetes Mother     Social History Social History  Substance Use Topics  . Smoking status: Current Every Day Smoker    Packs/day: 1.00    Types: Cigarettes    Start date: 09/28/1984  . Smokeless tobacco: Never Used  . Alcohol use No  Comment: occasional     Allergies   Toradol [ketorolac tromethamine]   Review of Systems Review of Systems  All other systems reviewed and are negative.    Physical Exam Updated Vital Signs BP 121/97 (BP Location: Left Arm)   Pulse 87   Temp 98.2 F (36.8 C) (Oral)   Resp 16   Ht 6' (1.829 m)   Wt 185 lb (83.9 kg)   SpO2 97%   BMI 25.09 kg/m   Physical Exam  Constitutional: He is oriented to person, place, and time. He appears well-developed and well-nourished.  HENT:  Head: Normocephalic and atraumatic.    Right Ear: External ear normal.  Left Ear: External ear normal.  Hoarse quality to voice  Eyes: Conjunctivae and EOM are normal. Pupils are equal, round, and reactive to light.  Neck: Normal range of motion and phonation normal. Neck supple.  Cardiovascular: Normal rate, regular rhythm and normal heart sounds.   Pulmonary/Chest: Effort normal and breath sounds normal. No stridor. No respiratory distress (.ewed). He has no wheezes. He exhibits no bony tenderness.  Musculoskeletal: Normal range of motion.  Neurological: He is alert and oriented to person, place, and time. No cranial nerve deficit or sensory deficit. He exhibits normal muscle tone. Coordination normal.  Skin: Skin is warm, dry and intact.  Psychiatric: He has a normal mood and affect. His behavior is normal. Judgment and thought content normal.  Nursing note and vitals reviewed.    ED Treatments / Results  Labs (all labs ordered are listed, but only abnormal results are displayed) Labs Reviewed - No data to display  EKG  EKG Interpretation None       Radiology No results found.  Procedures Procedures (including critical care time)  Medications Ordered in ED Medications - No data to display   Initial Impression / Assessment and Plan / ED Course  I have reviewed the triage vital signs and the nursing notes.  Pertinent labs & imaging results that were available during my care of the patient were reviewed by me and considered in my medical decision making (see chart for details).  Clinical Course    Medications - No data to display  Patient Vitals for the past 24 hrs:  BP Temp Temp src Pulse Resp SpO2 Height Weight  09/21/15 1110 121/97 98.2 F (36.8 C) Oral 87 16 97 % 6' (1.829 m) 185 lb (83.9 kg)    11:35 AM Reevaluation with update and discussion. After initial assessment and treatment, an updated evaluation reveals Findings discussed with patient and all questions were answered. Kenzlie Disch L     Final Clinical Impressions(s) / ED Diagnoses   Final diagnoses:  Hoarseness, persistent  Tobacco abuse    Nonspecific hoarseness, possibly related to an acute infectious process. Chronic smoker, with risk for laryngeal disorder. Doubt acute bronchitis, serious bacterial infection or impending vascular collapse.  Nursing Notes Reviewed/ Care Coordinated Applicable Imaging Reviewed Interpretation of Laboratory Data incorporated into ED treatment  The patient appears reasonably screened and/or stabilized for discharge and I doubt any other medical condition or other [facility] requiring further screening, evaluation, or treatment in the ED at this time prior to discharge.  Plan: Home Medications- continue; Home Treatments- stop smoking; return here if the recommended treatment, does not improve the symptoms; Recommended follow up- ENT for possible laryngoscopy   New Prescriptions New Prescriptions   No medications on file     Mancel Bale, MD 09/21/15 1137

## 2015-10-06 ENCOUNTER — Ambulatory Visit: Payer: Medicaid Other | Admitting: Cardiovascular Disease

## 2015-10-20 ENCOUNTER — Encounter: Payer: Self-pay | Admitting: Adult Health

## 2015-10-20 ENCOUNTER — Ambulatory Visit (INDEPENDENT_AMBULATORY_CARE_PROVIDER_SITE_OTHER): Payer: Medicaid Other | Admitting: Adult Health

## 2015-10-20 VITALS — BP 106/70 | HR 78 | Ht 73.0 in | Wt 173.0 lb

## 2015-10-20 DIAGNOSIS — I428 Other cardiomyopathies: Secondary | ICD-10-CM

## 2015-10-20 DIAGNOSIS — I429 Cardiomyopathy, unspecified: Secondary | ICD-10-CM | POA: Diagnosis not present

## 2015-10-20 DIAGNOSIS — R0602 Shortness of breath: Secondary | ICD-10-CM | POA: Diagnosis not present

## 2015-10-20 MED ORDER — METOPROLOL SUCCINATE ER 25 MG PO TB24: 25.0000 mg | ORAL_TABLET | Freq: Every day | ORAL | 11 refills | Status: AC

## 2015-10-20 NOTE — Progress Notes (Signed)
Name: Bradley Day    DOB: 1970/06/28  Age: 45 y.o.  MR#: [id number]       PCP:  Veto Kemps, FNP      Insurance: Payor: MEDICAID ADVANTAGE OUT OF STATE / Plan: BCBS MEDICAID ADVANTAGE / Product Type: *No Product type* /   CC:    Chief Complaint  Patient presents with  . Congestive Heart Failure  . Cardiomyopathy    VS Vitals:   10/20/15 1447  Pulse: 78  SpO2: 96%  Weight: 173 lb (78.5 kg)  Height: 6\' 1"  (1.854 m)    Weights Current Weight  10/20/15 173 lb (78.5 kg)  09/21/15 185 lb (83.9 kg)  05/31/15 184 lb (83.5 kg)    Blood Pressure  BP Readings from Last 3 Encounters:  09/21/15 121/97  05/31/15 103/70  04/28/15 124/76     Admit date:  (Not on file) Last encounter with RMR:  04/28/2015   Allergy Toradol [ketorolac tromethamine]  Current Outpatient Prescriptions  Medication Sig Dispense Refill  . amitriptyline (ELAVIL) 100 MG tablet Take 100 mg by mouth at bedtime.    . cholecalciferol (VITAMIN D) 1000 units tablet Take 1,000 Units by mouth daily.    . dorzolamide-timolol (COSOPT) 22.3-6.8 MG/ML ophthalmic solution Place 2 drops into the left eye 2 (two) times daily.    Marland Kitchen gabapentin (NEURONTIN) 600 MG tablet Take 600 mg by mouth 3 (three) times daily.    . methadone (DOLOPHINE) 10 MG tablet Take 90 mg by mouth daily.    . metoprolol tartrate (LOPRESSOR) 25 MG tablet Take 0.5 tablets (12.5 mg total) by mouth 2 (two) times daily. 90 tablet 3  . ranitidine (ZANTAC) 150 MG tablet Take 150 mg by mouth daily.     Marland Kitchen buPROPion (WELLBUTRIN XL) 150 MG 24 hr tablet Take 150 mg by mouth daily.      No current facility-administered medications for this visit.     Discontinued Meds:    Medications Discontinued During This Encounter  Medication Reason  . FLUoxetine (PROZAC) 20 MG capsule Error  . omeprazole (PRILOSEC) 20 MG capsule Error  . erythromycin ophthalmic ointment Error    Patient Active Problem List   Diagnosis Date Noted  . Protein-calorie malnutrition,  severe (HCC) 10/05/2012  . 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 Opioid withdrawal (HCC) 10/03/2012  . Weakness of left leg 10/03/2012  . Nonischemic cardiomyopathy (HCC) 09/25/2012  . Rhabdomyolysis 09/21/2012  . AKI (acute kidney injury) (HCC) 09/21/2012  . NSTEMI (non-ST elevated myocardial infarction) (HCC) 09/21/2012  . Urinary retention 09/21/2012  . Drug overdose, multiple drugs 09/21/2012  . Transaminitis 09/21/2012  . Hypokalemia 09/21/2012  . Diabetes mellitus (HCC) 09/21/2012  . Chronic back pain 09/21/2012    LABS    Component Value Date/Time   NA 134 (L) 08/13/2014 1701   NA 136 (L) 05/10/2013 1308   NA 139 03/23/2013 1843   K 3.7 08/13/2014 1701   K 4.2 05/10/2013 1308   K 3.8 03/23/2013 1843   CL 99 (L) 08/13/2014 1701   CL 100 05/10/2013 1308   CL 97 03/23/2013 1843   CO2 24 08/13/2014 1701   CO2 28 05/10/2013 1308   CO2 30 03/23/2013 1843   GLUCOSE 161 (H) 08/13/2014 1701   GLUCOSE 105 (H) 05/10/2013 1308   GLUCOSE 95 03/23/2013 1843   BUN 18 08/13/2014 1701   BUN 15 05/10/2013 1308   BUN 13 03/23/2013 1843   CREATININE 0.99 08/13/2014 1701   CREATININE 0.72 05/10/2013 1308   CREATININE  0.81 03/23/2013 1843   CALCIUM 8.9 08/13/2014 1701   CALCIUM 9.1 05/10/2013 1308   CALCIUM 9.7 03/23/2013 1843   GFRNONAA >60 08/13/2014 1701   GFRNONAA >90 05/10/2013 1308   GFRNONAA >90 03/23/2013 1843   GFRAA >60 08/13/2014 1701   GFRAA >90 05/10/2013 1308   GFRAA >90 03/23/2013 1843   CMP     Component Value Date/Time   NA 134 (L) 08/13/2014 1701   K 3.7 08/13/2014 1701   CL 99 (L) 08/13/2014 1701   CO2 24 08/13/2014 1701   GLUCOSE 161 (H) 08/13/2014 1701   BUN 18 08/13/2014 1701   CREATININE 0.99 08/13/2014 1701   CALCIUM 8.9 08/13/2014 1701   PROT 7.9 03/23/2013 1843   ALBUMIN 3.9 03/23/2013 1843   AST 18 03/23/2013 1843   ALT 21 03/23/2013 1843   ALKPHOS 107 03/23/2013 1843   BILITOT 0.3 03/23/2013 1843   GFRNONAA >60 08/13/2014 1701   GFRAA >60 08/13/2014  1701       Component Value Date/Time   WBC 10.4 08/13/2014 1701   WBC 5.6 05/10/2013 1308   WBC 10.5 03/23/2013 1843   HGB 14.1 08/13/2014 1701   HGB 13.5 05/10/2013 1308   HGB 14.4 03/23/2013 1843   HCT 40.5 08/13/2014 1701   HCT 38.6 (L) 05/10/2013 1308   HCT 40.4 03/23/2013 1843   MCV 90.0 08/13/2014 1701   MCV 88.5 05/10/2013 1308   MCV 88.2 03/23/2013 1843    Lipid Panel  No results found for: CHOL, TRIG, HDL, CHOLHDL, VLDL, LDLCALC, LDLDIRECT  ABG    Component Value Date/Time   PHART 7.428 09/21/2012 2154   PCO2ART 46.1 (H) 09/21/2012 2154   PO2ART 55.6 (L) 09/21/2012 2154   HCO3 29.9 (H) 09/21/2012 2154   O2SAT 89.0 09/21/2012 2154     No results found for: TSH BNP (last 3 results) No results for input(s): BNP in the last 8760 hours.  ProBNP (last 3 results) No results for input(s): PROBNP in the last 8760 hours.  Cardiac Panel (last 3 results) No results for input(s): CKTOTAL, CKMB, TROPONINI, RELINDX in the last 72 hours.  Iron/TIBC/Ferritin/ %Sat No results found for: IRON, TIBC, FERRITIN, IRONPCTSAT   EKG Orders placed or performed in visit on 10/20/15  . EKG 12-Lead     Prior Assessment and Plan Problem List as of 10/20/2015 Reviewed: 09/21/2015 11:15 AM by Flint MelterWENTZ,ELLIOTT L, MD     Cardiovascular and Mediastinum   NSTEMI (non-ST elevated myocardial infarction) (HCC)   Nonischemic cardiomyopathy (HCC)     Endocrine   Diabetes mellitus (HCC)     Nervous and Auditory   Opioid withdrawal (HCC)   Weakness of left leg     Musculoskeletal and Integument   Rhabdomyolysis   Myositis     Genitourinary   AKI (acute kidney injury) (HCC)   Urinary retention     Other   Drug overdose, multiple drugs   Transaminitis   Hypokalemia   Chronic back pain   Protein-calorie malnutrition, severe (HCC)       Imaging: No results found.

## 2015-10-20 NOTE — Progress Notes (Signed)
Cardiology Office Note   Date:  10/20/2015   ID:  Bradley Day, DOB [birth year], MRN [id number]  PCP:  Veto KempsBANNER, KELLI, FNP  Cardiologist: Inis SizerKoneswaran/   Kathryn Lawrence, NP   Chief Complaint  Patient presents with  . Congestive Heart Failure  . Cardiomyopathy      History of Present Illness: Bradley Day is a 45 y.o. male who presents for ongoing assessment of NICM, EF of 40%, diffuse hypokineses, and left ventricular apical false tendon. Other history of emphysema, depression, NSTEMI, and CHF. Was last seen by Dr. Purvis SheffieldKoneswaran he was symptomatically stable.   Echocardiogram 04/23/2015 Left ventricle: The cavity size was mildly dilated. Wall   thickness was normal. The estimated ejection fraction was 40%.   Diffuse hypokinesis. Incidentally noted transverse false tendon   near LV apex. Left ventricular diastolic function parameters were   normal. - Mitral valve: Calcified annulus. There was trivial regurgitation. - Right atrium: Central venous pressure (est): 3 mm Hg. - Atrial septum: No defect or patent foramen ovale was identified. - Tricuspid valve: There was trivial regurgitation. - Pulmonary arteries: PA peak pressure: 21 mm Hg (S). - Pericardium, extracardiac: There was no pericardial effusion.  Impressions:  - Mild LV chamber dilatation with normal wall thickness and LVEF   approximately 40%. Grossly normal diastolic function. Trivial   mitral and tricuspid regurgitation. Normal estimated PASP 21   mmHg.  He complains of significant fatigue. States that he has diaphoretic spells as well. No chest pain or near syncope.   Past Medical History:  Diagnosis Date  . Bipolar 1 disorder (HCC)   . Cardiomyopathy (HCC)   . CHF (congestive heart failure) (HCC)   . Depression   . Emphysema   . IV drug user    last IV drug use in 2011  . NSTEMI (non-ST elevated myocardial infarction) (HCC)   . Pulmonary embolism [facility](H[facility]) August 2013   off anticoagulation 12/2011 per pt  .  Rhabdomyolysis     Past Surgical History:  Procedure Laterality Date  . HAND SURGERY       Current Outpatient Prescriptions  Medication Sig Dispense Refill  . amitriptyline (ELAVIL) 100 MG tablet Take 100 mg by mouth at bedtime.    . cholecalciferol (VITAMIN D) 1000 units tablet Take 1,000 Units by mouth daily.    . dorzolamide-timolol (COSOPT) 22.3-6.8 MG/ML ophthalmic solution Place 2 drops into the left eye 2 (two) times daily.    Marland Kitchen. gabapentin (NEURONTIN) 600 MG tablet Take 600 mg by mouth 3 (three) times daily.    . metoprolol tartrate (LOPRESSOR) 25 MG tablet Take 0.5 tablets (12.5 mg total) by mouth 2 (two) times daily. 90 tablet 3   No current facility-administered medications for this visit.     Allergies:   Toradol [ketorolac tromethamine]    Social History:  The patient  reports that he has been smoking Cigarettes.  He started smoking about 31 years ago. He has been smoking about 1.00 pack per day. He has never used smokeless tobacco. He reports that he does not drink alcohol or use drugs.   Family History:  The patient's family history includes CAD in his father and maternal grandmother; Cancer - Prostate in his maternal grandfather; Diabetes in his maternal grandfather and mother; Hypertension in his maternal grandfather.    ROS: All other systems are reviewed and negative. Unless otherwise mentioned in H&P    PHYSICAL EXAM: VS:  Pulse 78   Ht 6\' 1"  (1.854 m)   Wt  173 lb (78.5 kg)   SpO2 96%   BMI 22.82 kg/m  , BMI Body mass index is 22.82 kg/m. 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  Skin: warm and dry, no rash Neuro:  Strength and sensation  are intact Psych: euthymic mood, full affect   Lipid Panel No results found for: CHOL, TRIG, HDL, CHOLHDL, VLDL, LDLCALC, LDLDIRECT    Wt Readings from Last 3 Encounters:  10/20/15 173 lb (78.5 kg)  09/21/15 185 lb (83.9 kg)  05/31/15 184 lb (83.5 kg)    EKG: NSR with non-specific   ASSESSMENT AND PLAN:  1.  NICM: Significant fatigue with EF of 40%. Denies palpitations or rapid HR. Will change metoprolol 12.5 mg BID to long acting 25 mg at HS to see if this will help with his fatigue. Otherwise will continue current regimen as directed.   2. Tobacco abuse: He is trying to cut down. Now "rolling his own" and down to 5 cigarettes a day.   3. GERD: Continue PPI.    Current medicines are reviewed at length with the patient today.    Labs/ tests ordered today include:   Orders Placed This Encounter  Procedures  . EKG 12-Lead     Disposition:   FU with  6 months   Signed, Joni Reining, NP  10/20/2015 2:49 PM    [facility] [address] [place], [place] [address] Phone: [phone]; Fax: [phone]

## 2015-10-20 NOTE — Patient Instructions (Signed)
Your physician wants you to follow-up in: 6 Months with Dr. Purvis Sheffield. You will receive a reminder letter in the mail two months in advance. If you don't receive a letter, please call our office to schedule the follow-up appointment.  Your physician has recommended you make the following change in your medication: Toprol XL 25 mg Take One Daily at Bedtime.   Your physician has requested that you have an echocardiogram. Just before your next visit. Echocardiography is a painless test that uses sound waves to create images of your heart. It provides your doctor with information about the size and shape of your heart and how well your heart's chambers and valves are working. This procedure takes approximately one hour. There are no restrictions for this procedure.  If you need a refill on your cardiac medications before your next appointment, please call your pharmacy.  Thank you for choosing [facility]!

## 2016-04-25 ENCOUNTER — Telehealth: Payer: Self-pay | Admitting: Cardiovascular Disease

## 2016-04-25 DIAGNOSIS — Z79899 Other long term (current) drug therapy: Secondary | ICD-10-CM

## 2016-04-25 NOTE — Telephone Encounter (Signed)
Patient called about 10 pound fluid gain.  Stated all in his stomach area.  He is really concerned about this gain If patient does not answer leave message and he will call right back.  They are having problems with their phones

## 2016-04-25 NOTE — Telephone Encounter (Signed)
Returned pt call, left message for pt to call back.

## 2016-04-25 NOTE — Telephone Encounter (Signed)
Spoke to pt. He states his weight is 183 lbs today. It has been up to 187 this week. He is not having any SOB nor swelling in his legs/nkles. He states his jeans are tight around his stomach and he was scared it may be gathering fluid there. He did say that he eats a lot of cheesecake and swiss rolls lately, but it usually doesn't make him gain weigh. He made an appointment to see Joni Reining, N.P. 3/15 @ 3:10.

## 2016-04-26 MED ORDER — FUROSEMIDE 40 MG PO TABS: 40.0000 mg | ORAL_TABLET | Freq: Every day | ORAL | 0 refills | Status: AC

## 2016-04-26 NOTE — Telephone Encounter (Signed)
Pt made aware to take 40 mg of lasix for next 3 days and get lab work on Monday.

## 2016-04-26 NOTE — Telephone Encounter (Signed)
Can try Lasix 40 mg daily x 3 days with BMET in 3 days.

## 2016-05-03 ENCOUNTER — Encounter: Payer: Self-pay | Admitting: Adult Health

## 2016-05-03 ENCOUNTER — Ambulatory Visit: Payer: Medicaid Other | Admitting: Adult Health

## 2016-05-17 ENCOUNTER — Other Ambulatory Visit: Payer: Medicaid Other

## 2016-05-24 ENCOUNTER — Encounter: Payer: Self-pay | Admitting: Adult Health

## 2016-05-24 ENCOUNTER — Other Ambulatory Visit: Payer: Self-pay | Admitting: Cardiovascular Disease

## 2016-05-24 ENCOUNTER — Ambulatory Visit: Payer: Medicaid Other | Admitting: Adult Health

## 2016-05-24 NOTE — Telephone Encounter (Signed)
I see patient was told to take lasix 40 mg on 04/26/16 and then get labs.Is he to stay on lasix daily? He did not get lab either    Please advise

## 2016-05-24 NOTE — Progress Notes (Deleted)
Cardiology Office Note   Date:  05/24/2016   ID:  Bradley Day, DOB 05-Jun-1970, MRN [id number]  PCP:  Veto Kemps, FNP  Cardiologist: Inis Sizer, NP   No chief complaint on file.     History of Present Illness: Bradley Day is a 46 y.o. male who presents for ongoing assessment and management of NICM, most recent EF of 40%, Chronic systolic CHF, NSTEMI. Was last seen in the office on 10/20/2015, and had complained of fatigue. Metoprolol was changed from BID dosing to long acting dose. Since being seen last, he called our office concerned about weight gain of 5 lbs.  He was advised by Dr. Purvis Sheffield to increase his lasix to 40 mg daily X 3 days, then resume 20 mg daily.   Follow up labs were ordered. There is no record of labs at this time.                                            Past Medical History:  Diagnosis Date  . Bipolar 1 disorder (HCC)   . Cardiomyopathy (HCC)   . CHF (congestive heart failure) (HCC)   . Depression   . Emphysema   . IV drug user    last IV drug use in 2011  . NSTEMI (non-ST elevated myocardial infarction) (HCC)   . Pulmonary embolism [facility]) August 2013   off anticoagulation 12/2011 per pt  . Rhabdomyolysis     Past Surgical History:  Procedure Laterality Date  . HAND SURGERY       Current Outpatient Prescriptions  Medication Sig Dispense Refill  . amitriptyline (ELAVIL) 100 MG tablet Take 100 mg by mouth at bedtime.    Marland Kitchen buPROPion (WELLBUTRIN XL) 150 MG 24 hr tablet Take 150 mg by mouth daily.     . cholecalciferol (VITAMIN D) 1000 units tablet Take 1,000 Units by mouth daily.    . dorzolamide-timolol (COSOPT) 22.3-6.8 MG/ML ophthalmic solution Place 2 drops into the left eye 2 (two) times daily.    . furosemide (LASIX) 40 MG tablet Take 1 tablet (40 mg total) by mouth daily. Take 40 mg of lasix daily for next 3 days. 30 tablet 0  . gabapentin (NEURONTIN) 600 MG tablet Take 600 mg by mouth 3 (three) times daily.    .  methadone (DOLOPHINE) 10 MG tablet Take 90 mg by mouth daily.    . metoprolol succinate (TOPROL XL) 25 MG 24 hr tablet Take 1 tablet (25 mg total) by mouth at bedtime. 30 tablet 11  . ranitidine (ZANTAC) 150 MG tablet Take 150 mg by mouth daily.      No current facility-administered medications for this visit.     Allergies:   Toradol [ketorolac tromethamine]    Social History:  The patient  reports that he has been smoking Cigarettes.  He started smoking about 31 years ago. He has been smoking about 1.00 pack per day. He has never used smokeless tobacco. He reports that he does not drink alcohol or use drugs.   Family History:  The patient's family history includes CAD in his father and maternal grandmother; Cancer - Prostate in his maternal grandfather; Diabetes in his maternal grandfather and mother; Hypertension in his maternal grandfather.    ROS: All other systems are reviewed and negative. Unless otherwise mentioned in H&P    PHYSICAL EXAM: VS:  There  were no vitals taken for this visit. , BMI There is no height or weight on file to calculate BMI. GEN: Well nourished, well developed, in no acute distress  HEENT: normal  Neck: no JVD, carotid bruits, or masses Cardiac: ***RRR; no murmurs, rubs, or gallops,no edema  Respiratory:  clear to auscultation bilaterally, normal work of breathing GI: soft, nontender, nondistended, + BS MS: no deformity or atrophy  Skin: warm and dry, no rash Neuro:  Strength and sensation are intact Psych: euthymic mood, full affect   EKG:  EKG {ACTION; IS/IS [id number]} ordered today. The ekg ordered today demonstrates ***   Recent Labs: No results found for requested labs within last 8760 hours.    Lipid Panel No results found for: CHOL, TRIG, HDL, CHOLHDL, VLDL, LDLCALC, LDLDIRECT    Wt Readings from Last 3 Encounters:  10/20/15 173 lb (78.5 kg)  09/21/15 185 lb (83.9 kg)  05/31/15 184 lb (83.5 kg)      Other studies  Reviewed: Echocardiogram 04/23/2015 Left ventricle: The cavity size was mildly dilated. Wall thickness was normal. The estimated ejection fraction was 40%. Diffuse hypokinesis. Incidentally noted transverse false tendon near LV apex. Left ventricular diastolic function parameters were normal. - Mitral valve: Calcified annulus. There was trivial regurgitation. - Right atrium: Central venous pressure (est): 3 mm Hg. - Atrial septum: No defect or patent foramen ovale was identified. - Tricuspid valve: There was trivial regurgitation. - Pulmonary arteries: PA peak pressure: 21 mm Hg (S). - Pericardium, extracardiac: There was no pericardial effusion.  Impressions:  - Mild LV chamber dilatation with normal wall thickness and LVEF approximately 40%. Grossly normal diastolic function. Trivial mitral and tricuspid regurgitation. Normal estimated PASP 21 mmHg.  ASSESSMENT AND PLAN:  1.  ***   Current medicines are reviewed at length with the patient today.    Labs/ tests ordered today include: *** No orders of the defined types were placed in this encounter.    Disposition:   FU with *** in {gen number [id number]} {TIME; UNITS DAY/WEEK/MONTH:19136}   Signed, Joni Reining, NP  05/24/2016 6:49 AM    [facility] [address], [place], [place] [address] Phone: [phone]; Fax: [phone]

## 2016-05-24 NOTE — Telephone Encounter (Signed)
Was only supposed to take for 3 days.

## 2016-06-29 ENCOUNTER — Other Ambulatory Visit: Payer: Self-pay | Admitting: Cardiovascular Disease

## 2016-10-12 ENCOUNTER — Telehealth: Payer: Self-pay | Admitting: Adult Health

## 2016-10-12 NOTE — Telephone Encounter (Signed)
Numerous attempts to contact patient with recall letters. Unable to reach by telephone. with no success.  /27/2018 9:06 AM New [10]     Zachary George T [[id number]] 06/15/2016 9:07 AM Notification Sent [20]   Megan Salon [[id number]] 09/13/2016 7:16 AM Notification Sent [54

## 2018-06-19 ENCOUNTER — Encounter (HOSPITAL_COMMUNITY): Payer: Self-pay | Admitting: Emergency Medicine

## 2018-06-19 ENCOUNTER — Emergency Department (HOSPITAL_COMMUNITY)
Admission: EM | Admit: 2018-06-19 | Discharge: 2018-06-19 | Disposition: A | Discharge status: Home / Self Care | Payer: Medicaid Other | Attending: Emergency Medicine | Admitting: Emergency Medicine

## 2018-06-19 ENCOUNTER — Other Ambulatory Visit: Payer: Self-pay

## 2018-06-19 DIAGNOSIS — Z79899 Other long term (current) drug therapy: Secondary | ICD-10-CM | POA: Diagnosis not present

## 2018-06-19 DIAGNOSIS — E119 Type 2 diabetes mellitus without complications: Secondary | ICD-10-CM | POA: Insufficient documentation

## 2018-06-19 DIAGNOSIS — F17210 Nicotine dependence, cigarettes, uncomplicated: Secondary | ICD-10-CM | POA: Diagnosis not present

## 2018-06-19 DIAGNOSIS — I509 Heart failure, unspecified: Secondary | ICD-10-CM | POA: Insufficient documentation

## 2018-06-19 DIAGNOSIS — R112 Nausea with vomiting, unspecified: Secondary | ICD-10-CM | POA: Insufficient documentation

## 2018-06-19 LAB — COMPREHENSIVE METABOLIC PANEL
ALT: 17 U/L (ref 0–44)
AST: 17 U/L (ref 15–41)
Albumin: 4.3 g/dL (ref 3.5–5.0)
Alkaline Phosphatase: 98 U/L (ref 38–126)
Anion gap: 12 (ref 5–15)
BUN: 18 mg/dL (ref 6–20)
CO2: 27 mmol/L (ref 22–32)
Calcium: 9.2 mg/dL (ref 8.9–10.3)
Chloride: 97 mmol/L — ABNORMAL LOW (ref 98–111)
Creatinine, Ser: 0.83 mg/dL (ref 0.61–1.24)
GFR calc Af Amer: >60 mL/min (ref >60)
GFR calc non Af Amer: >60 mL/min (ref >60)
Glucose, Bld: 112 mg/dL — ABNORMAL HIGH (ref 70–99)
Potassium: 3.5 mmol/L (ref 3.5–5.1)
Sodium: 136 mmol/L (ref 135–145)
Total Bilirubin: 0.4 mg/dL (ref 0.3–1.2)
Total Protein: 8.3 g/dL — ABNORMAL HIGH (ref 6.5–8.1)

## 2018-06-19 LAB — CBC WITH DIFFERENTIAL/PLATELET
Abs Immature Granulocytes: 0.02 10*3/uL (ref 0.00–0.07)
Basophils Absolute: 0 10*3/uL (ref 0.0–0.1)
Basophils Relative: 0 %
Eosinophils Absolute: 0.3 10*3/uL (ref 0.0–0.5)
Eosinophils Relative: 3 %
HCT: 41.1 % (ref 39.0–52.0)
Hemoglobin: 14.2 g/dL (ref 13.0–17.0)
Immature Granulocytes: 0 %
Lymphocytes Relative: 19 %
Lymphs Abs: 1.8 10*3/uL (ref 0.7–4.0)
MCH: 31 pg (ref 26.0–34.0)
MCHC: 34.5 g/dL (ref 30.0–36.0)
MCV: 89.7 fL (ref 80.0–100.0)
Monocytes Absolute: 0.6 10*3/uL (ref 0.1–1.0)
Monocytes Relative: 7 %
Neutro Abs: 6.8 10*3/uL (ref 1.7–7.7)
Neutrophils Relative %: 71 %
Platelets: 401 10*3/uL — ABNORMAL HIGH (ref 150–400)
RBC: 4.58 MIL/uL (ref 4.22–5.81)
RDW: 12.1 % (ref 11.5–15.5)
WBC: 9.6 10*3/uL (ref 4.0–10.5)
nRBC: 0 % (ref 0.0–0.2)

## 2018-06-19 LAB — URINALYSIS, ROUTINE W REFLEX MICROSCOPIC
Bilirubin Urine: NEGATIVE
Glucose, UA: NEGATIVE mg/dL
Hgb urine dipstick: NEGATIVE
Ketones, ur: NEGATIVE mg/dL
Leukocytes,Ua: NEGATIVE
Nitrite: NEGATIVE
Protein, ur: NEGATIVE mg/dL
Specific Gravity, Urine: 1.01 (ref 1.005–1.030)
pH: 6 (ref 5.0–8.0)

## 2018-06-19 LAB — TROPONIN I: Troponin I: <0.03 ng/mL (ref <0.03)

## 2018-06-19 LAB — LIPASE, BLOOD: Lipase: 20 U/L (ref 11–51)

## 2018-06-19 MED ORDER — BUPROPION HCL ER (XL) 150 MG PO TB24: 150.0000 mg | ORAL_TABLET | Freq: Every day | ORAL | 0 refills | Status: AC

## 2018-06-19 MED ORDER — SODIUM CHLORIDE 0.9 % IV BOLUS
1000.0000 mL | Freq: Once | INTRAVENOUS | Status: AC
  Administered 2018-06-19: 1000 mL via INTRAVENOUS

## 2018-06-19 MED ORDER — METOCLOPRAMIDE HCL 5 MG/ML IJ SOLN
10.0000 mg | Freq: Once | INTRAMUSCULAR | Status: AC
  Administered 2018-06-19: 10 mg via INTRAVENOUS
  Filled 2018-06-19: qty 2

## 2018-06-19 MED ORDER — AMITRIPTYLINE HCL 100 MG PO TABS: 100.0000 mg | ORAL_TABLET | Freq: Every day | ORAL | 0 refills | Status: AC

## 2018-06-19 MED ORDER — METOCLOPRAMIDE HCL 10 MG PO TABS: 10.0000 mg | ORAL_TABLET | Freq: Four times a day (QID) | ORAL | 0 refills | Status: AC | PRN

## 2018-06-19 MED ORDER — GABAPENTIN 600 MG PO TABS: 600.0000 mg | ORAL_TABLET | Freq: Three times a day (TID) | ORAL | 0 refills | Status: AC

## 2018-06-19 NOTE — ED Notes (Signed)
Pt states he does not need to urinate at this time, given urinal, aware of DO.  

## 2018-06-19 NOTE — ED Triage Notes (Signed)
Pt states he has been unable to eat well since April 4th 2020. Pt states that when he belches it "smells like a rotten egg." Pt denies pain at this time. Pt was seen in [place] and was prescribed zofran with relief.

## 2018-06-19 NOTE — ED Provider Notes (Signed)
[facility] EMERGENCY DEPARTMENT Provider Note   CSN: [id number] Arrival date & time: 06/19/18  0155    History   Chief Complaint Chief Complaint  Patient presents with  . Emesis    HPI Bradley Day is a 48 y.o. male.   The history is provided by the patient.  He has history of non-STEMI, congestive heart failure, bipolar disorder, diabetes and comes in complaining of vomiting today.  He states that he had been seen in the emergency department at [facility] about 3 weeks ago.  At that time, he had been having nausea and vomiting for about 1 week.  He was given ondansetron and nausea got better but he had severe diarrhea following that.  Throughout this, he states that he has had very little appetite eating as little as a can of soup a day.  Vomiting recurred this morning.  He denies abdominal pain or chest pain but he states he is concerned about his heart.  He did have several episodes of orthostatic dizziness.  He denies fever chills or sweats.  He thinks he has lost weight, but states that his scale at home has not been working.  Past Medical History:  Diagnosis Date  . Bipolar 1 disorder (HCC)   . Cardiomyopathy (HCC)   . CHF (congestive heart failure) (HCC)   . Depression   . Emphysema   . IV drug user    last IV drug use in 2011  . NSTEMI (non-ST elevated myocardial infarction) (HCC)   . Pulmonary embolism [facility]) August 2013   off anticoagulation 12/2011 per pt  . Rhabdomyolysis     Patient Active Problem List   Diagnosis Date Noted  . Protein-calorie malnutrition, severe (HCC) 10/05/2012  . 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 Opioid withdrawal (HCC) 10/03/2012  . Weakness of left leg 10/03/2012  . Nonischemic cardiomyopathy (HCC) 09/25/2012  . Rhabdomyolysis 09/21/2012  . AKI (acute kidney injury) (HCC) 09/21/2012  . NSTEMI (non-ST elevated myocardial infarction) (HCC) 09/21/2012  . Urinary retention 09/21/2012  . Drug overdose, multiple drugs 09/21/2012  . Transaminitis  09/21/2012  . Hypokalemia 09/21/2012  . Diabetes mellitus (HCC) 09/21/2012  . Chronic back pain 09/21/2012    Past Surgical History:  Procedure Laterality Date  . HAND SURGERY          Home Medications    Prior to Admission medications   Medication Sig Start Date End Date Taking? Authorizing Provider  amitriptyline (ELAVIL) 100 MG tablet Take 100 mg by mouth at bedtime.    [provider]  buPROPion (WELLBUTRIN XL) 150 MG 24 hr tablet Take 150 mg by mouth daily.  10/14/15   [provider]  cholecalciferol (VITAMIN D) 1000 units tablet Take 1,000 Units by mouth daily.    [provider]  dorzolamide-timolol (COSOPT) 22.3-6.8 MG/ML ophthalmic solution Place 2 drops into the left eye 2 (two) times daily. 05/14/15   [provider]  furosemide (LASIX) 40 MG tablet Take 1 tablet (40 mg total) by mouth daily. Take 40 mg of lasix daily for next 3 days. 04/26/16   Laqueta Linden, MD  gabapentin (NEURONTIN) 600 MG tablet Take 600 mg by mouth 3 (three) times daily.    [provider]  methadone (DOLOPHINE) 10 MG tablet Take 90 mg by mouth daily.    [provider]  metoprolol succinate (TOPROL XL) 25 MG 24 hr tablet Take 1 tablet (25 mg total) by mouth at bedtime. 10/20/15   Jodelle Gross, NP  ranitidine (  ZANTAC) 150 MG tablet Take 150 mg by mouth daily.  10/10/15   [provider]    Family History Family History  Problem Relation Age of Onset  . CAD Father   . CAD Maternal Grandmother   . Hypertension Maternal Grandfather   . Diabetes Maternal Grandfather   . Cancer - Prostate Maternal Grandfather   . Diabetes Mother     Social History Social History   Tobacco Use  . Smoking status: Current Every Day Smoker    Packs/day: 1.00    Types: Cigarettes    Start date: 09/28/1984  . Smokeless tobacco: Never Used  Substance Use Topics  . Alcohol use: No    Alcohol/week: 0.0 standard drinks    Comment: occasional   . Drug use: No    Comment: crack     Allergies   Toradol [ketorolac tromethamine]   Review of Systems Review of Systems  All other systems reviewed and are negative.    Physical Exam Updated Vital Signs BP 125/88 (BP Location: Right Arm)   Pulse (!) 120   Temp 97.8 F (36.6 C) (Oral)   Resp 19   SpO2 100%   Physical Exam Vitals signs and nursing note reviewed.    48 year old male, resting comfortably and in no acute distress. Vital signs are significant for elevated heart rate. Oxygen saturation is 100%, which is normal. Head is normocephalic and atraumatic. PERRLA, EOMI. Oropharynx is clear. Neck is nontender and supple without adenopathy or JVD. Back is nontender and there is no CVA tenderness. Lungs are clear without rales, wheezes, or rhonchi. Chest is nontender. Heart has regular rate and rhythm without murmur. Abdomen is soft, flat, nontender without masses or hepatosplenomegaly and peristalsis is hypoactive. Extremities have no cyanosis or edema, full range of motion is present. Skin is warm and dry without rash. Neurologic: Mental status is normal, cranial nerves are intact, there are no motor or sensory deficits.  ED Treatments / Results  Labs (all labs ordered are listed, but only abnormal results are displayed) Labs Reviewed  COMPREHENSIVE METABOLIC PANEL - Abnormal; Notable for the following components:      Result Value   Chloride 97 (*)    Glucose, Bld 112 (*)    Total Protein 8.3 (*)    All other components within normal limits  CBC WITH DIFFERENTIAL/PLATELET - Abnormal; Notable for the following components:   Platelets 401 (*)    All other components within normal limits  LIPASE, BLOOD  URINALYSIS, ROUTINE W REFLEX MICROSCOPIC  TROPONIN I    EKG EKG Interpretation  Date/Time:  Thursday June 19 2018 02:43:24 EDT Ventricular Rate:  108 PR Interval:    QRS Duration: 101 QT Interval:  321 QTC Calculation: 431 R Axis:   76 Text  Interpretation:  Sinus tachycardia Otherwise within normal limits When compared with ECG of 05/10/2013, HEART RATE has increased Confirmed by Dione Booze ([id number]) on 06/19/2018 2:58:11 AM  Procedures Procedures  Medications Ordered in ED Medications  sodium chloride 0.9 % bolus 1,000 mL (0 mLs Intravenous Stopped 06/19/18 0413)  metoCLOPramide (REGLAN) injection 10 mg (10 mg Intravenous Given 06/19/18 0303)     Initial Impression / Assessment and Plan / ED Course  I have reviewed the triage vital signs and the nursing notes.  Pertinent labs & imaging results that were available during my care of the patient were reviewed by me and considered in my medical decision making (see chart for details).  Anorexia with recurrent nausea  and vomiting.  Old records are reviewed, and he has no relevant past visits.  Will attempt to obtain ED records from his visit in [place].  In the meantime, he will be given IV fluids and screening labs obtained.  Will check ECG and troponin.  ECG is unremarkable and troponin is normal.  Labs are unremarkable including normal liver functions.  Glucose is only minimally elevated at 112.  He feels much better after IV fluids and metoclopramide.  He is discharged with prescription for metoclopramide.  He also states that he needs refills on sinus medications and is given a one-month supply of amitriptyline, bupropion, and gabapentin.  Final Clinical Impressions(s) / ED Diagnoses   Final diagnoses:  Non-intractable vomiting with nausea, unspecified vomiting type    ED Discharge Orders         Ordered    metoCLOPramide (REGLAN) 10 MG tablet  Every 6 hours PRN     06/19/18 0456    amitriptyline (ELAVIL) 100 MG tablet  Daily at bedtime     06/19/18 0501    buPROPion (WELLBUTRIN XL) 150 MG 24 hr tablet  Daily     06/19/18 0501    gabapentin (NEURONTIN) 600 MG tablet  3 times daily     06/19/18 0501           Dione Booze, MD 06/19/18 [phone]
